# Patient Record
Sex: Male | Born: 1960
Health system: Southern US, Community
[De-identification: ages and names within clinical notes are randomized; demographics above are authoritative.]

## PROBLEM LIST (undated history)

## (undated) DIAGNOSIS — T79A21A Traumatic compartment syndrome of right lower extremity, initial encounter: Secondary | ICD-10-CM

## (undated) DIAGNOSIS — Z9289 Personal history of other medical treatment: Secondary | ICD-10-CM

## (undated) DIAGNOSIS — K5900 Constipation, unspecified: Secondary | ICD-10-CM

## (undated) DIAGNOSIS — S92501A Displaced unspecified fracture of right lesser toe(s), initial encounter for closed fracture: Secondary | ICD-10-CM

## (undated) DIAGNOSIS — E119 Type 2 diabetes mellitus without complications: Secondary | ICD-10-CM

## (undated) DIAGNOSIS — I10 Essential (primary) hypertension: Secondary | ICD-10-CM

---

## 2013-01-08 ENCOUNTER — Emergency Department (INDEPENDENT_AMBULATORY_CARE_PROVIDER_SITE_OTHER)
Admission: EM | Admit: 2013-01-08 | Discharge: 2013-01-08 | Disposition: A | Discharge status: Home / Self Care | Payer: Self-pay | Source: Home / Self Care

## 2013-01-08 ENCOUNTER — Encounter (HOSPITAL_COMMUNITY): Payer: Self-pay

## 2013-01-08 DIAGNOSIS — I10 Essential (primary) hypertension: Secondary | ICD-10-CM

## 2013-01-08 HISTORY — DX: Essential (primary) hypertension: I10

## 2013-01-08 LAB — HEMOGLOBIN A1C
Hgb A1c MFr Bld: 6.1 % — ABNORMAL HIGH (ref <5.7)
Mean Plasma Glucose: 128 mg/dL — ABNORMAL HIGH (ref <117)

## 2013-01-08 MED ORDER — LISINOPRIL-HYDROCHLOROTHIAZIDE 10-12.5 MG PO TABS: 1.0000 | ORAL_TABLET | Freq: Every day | ORAL | Status: DC

## 2013-01-08 NOTE — ED Notes (Signed)
Patient has a history of HTN needs medication refill 

## 2013-01-08 NOTE — ED Provider Notes (Signed)
History     CSN: [id number]  Arrival date & time 01/08/13  1041   First MD Initiated Contact with Patient 01/08/13 1210      Chief Complaint  Patient presents with  . Medication Refill    (Consider location/radiation/quality/duration/timing/severity/associated sxs/prior treatment) HPI 52 year old overweight male with history off hypertension for past few years comes to the clinic for medication refill and establishing care. He informs moving from [place] over a year back and was taking HCTZ and lisinopril combination (12.5-10 mg daily) and has run out of his prescription for past few months already. He denies any headache, blurry vision, dizziness, chest pain, palpitations, shortness of breath, muscle aches, joint pain, cough, sore throat, runny nose, abdominal pain, bowel or urinary symptoms. Denies polyuria, polydipsia, change in weight or appetite. Denies smoking and drinks alcohol occasionally. He denies participating in routine physical activity. Denies any history of heart disease or any surgical history.  Past Medical History  Diagnosis Date  . Hypertension     History reviewed. No pertinent past surgical history.  Family history Father had diabetes  History  Substance Use Topics  . Smoking status: Never Smoker   . Smokeless tobacco: Not on file  . Alcohol Use: Yes      Review of Systems  Allergies  Review of patient's allergies indicates no known allergies.  Home Medications   Current Outpatient Rx  Name  Route  Sig  Dispense  Refill  . lisinopril-hydrochlorothiazide (PRINZIDE,ZESTORETIC) 10-12.5 MG per tablet   Oral   Take 1 tablet by mouth daily.           BP 132/87  Pulse 95  Temp(Src) 98.5 F (36.9 C) (Oral)  SpO2 100%  Physical Exam \ Middle aged overweight male in no acute distress HEENT: No pallor, moist oral mucosa Chest: Clear to auscultation bilaterally no added sounds CVS: No murmurs rub or gallop Abdomen: Soft, nontender,  nondistended, bowel sounds present Extremities: Warm, no edema CNS: AAO x3 ED Course  Procedures (including critical care time)  Labs Reviewed - No data to display No results found.  Hypertension Patient's blood pressure appears to be stable at this time. He has not been on medications for few months already. I will prescribe him lisinopril-HCTZ (10-to 0.5 mg daily) Patient counseled on monitoring his diet and attempting to diet with low sodium, avoiding fatty foods, caffeine and sugary beverages. Avoiding preservative foods high in sodium contents as much possible. Also counseled on admitting to his medications and monitoring his blood pressure as outpatient at least once a week He should followup in the clinic in next 3 months.  Health maintenance I will check a lipid panel, BMET. and a hemoglobin A1c   MDM  Follow up in 3 months. Will call patient for any abnormal labs        Eddie North, MD 01/08/13 1225

## 2013-08-02 ENCOUNTER — Other Ambulatory Visit: Payer: Self-pay | Admitting: Internal Medicine

## 2013-08-24 ENCOUNTER — Observation Stay (HOSPITAL_COMMUNITY)
Admission: EM | Admit: 2013-08-24 | Discharge: 2013-08-25 | Disposition: A | Discharge status: Home / Self Care | Payer: 59 | Attending: Internal Medicine | Admitting: Internal Medicine

## 2013-08-24 ENCOUNTER — Encounter (HOSPITAL_COMMUNITY): Payer: Self-pay | Admitting: Emergency Medicine

## 2013-08-24 DIAGNOSIS — Z794 Long term (current) use of insulin: Secondary | ICD-10-CM | POA: Insufficient documentation

## 2013-08-24 DIAGNOSIS — R739 Hyperglycemia, unspecified: Secondary | ICD-10-CM

## 2013-08-24 DIAGNOSIS — Z79899 Other long term (current) drug therapy: Secondary | ICD-10-CM | POA: Insufficient documentation

## 2013-08-24 DIAGNOSIS — IMO0001 Reserved for inherently not codable concepts without codable children: Principal | ICD-10-CM | POA: Insufficient documentation

## 2013-08-24 DIAGNOSIS — IMO0002 Reserved for concepts with insufficient information to code with codable children: Secondary | ICD-10-CM

## 2013-08-24 DIAGNOSIS — I10 Essential (primary) hypertension: Secondary | ICD-10-CM | POA: Insufficient documentation

## 2013-08-24 DIAGNOSIS — E1165 Type 2 diabetes mellitus with hyperglycemia: Secondary | ICD-10-CM | POA: Diagnosis present

## 2013-08-24 DIAGNOSIS — R7309 Other abnormal glucose: Secondary | ICD-10-CM

## 2013-08-24 HISTORY — DX: Personal history of other medical treatment: Z92.89

## 2013-08-24 HISTORY — DX: Type 2 diabetes mellitus without complications: E11.9

## 2013-08-24 LAB — POCT I-STAT, CHEM 8
BUN: 30 mg/dL — ABNORMAL HIGH (ref 6–23)
Calcium, Ion: 1.09 mmol/L — ABNORMAL LOW (ref 1.12–1.23)
Chloride: 96 mEq/L (ref 96–112)
Creatinine, Ser: 1.6 mg/dL — ABNORMAL HIGH (ref 0.50–1.35)
Glucose, Bld: >700 mg/dL (ref 70–99)
HCT: 39 % (ref 39.0–52.0)
Hemoglobin: 13.3 g/dL (ref 13.0–17.0)
Potassium: 4.9 mEq/L (ref 3.5–5.1)
Sodium: 130 mEq/L — ABNORMAL LOW (ref 135–145)
TCO2: 21 mmol/L (ref 0–100)

## 2013-08-24 LAB — COMPREHENSIVE METABOLIC PANEL
ALT: 22 U/L (ref 0–53)
AST: 13 U/L (ref 0–37)
Albumin: 3.8 g/dL (ref 3.5–5.2)
Alkaline Phosphatase: 128 U/L — ABNORMAL HIGH (ref 39–117)
BUN: 32 mg/dL — ABNORMAL HIGH (ref 6–23)
CO2: 19 mEq/L (ref 19–32)
Calcium: 8.8 mg/dL (ref 8.4–10.5)
Chloride: 77 mEq/L — ABNORMAL LOW (ref 96–112)
Creatinine, Ser: 1.54 mg/dL — ABNORMAL HIGH (ref 0.50–1.35)
GFR calc Af Amer: 59 mL/min — ABNORMAL LOW (ref >90)
GFR calc non Af Amer: 51 mL/min — ABNORMAL LOW (ref >90)
Glucose, Bld: 1270 mg/dL (ref 70–99)
Potassium: 4.8 mEq/L (ref 3.5–5.1)
Sodium: 117 mEq/L — CL (ref 135–145)
Total Bilirubin: 1.1 mg/dL (ref 0.3–1.2)
Total Protein: 7.3 g/dL (ref 6.0–8.3)

## 2013-08-24 LAB — CBC
HCT: 42.8 % (ref 39.0–52.0)
Hemoglobin: 14.1 g/dL (ref 13.0–17.0)
MCH: 29.2 pg (ref 26.0–34.0)
MCHC: 32.9 g/dL (ref 30.0–36.0)
MCV: 88.6 fL (ref 78.0–100.0)
Platelets: 283 10*3/uL (ref 150–400)
RBC: 4.83 MIL/uL (ref 4.22–5.81)
RDW: 12.7 % (ref 11.5–15.5)
WBC: 8.6 10*3/uL (ref 4.0–10.5)

## 2013-08-24 LAB — GLUCOSE, CAPILLARY
Glucose-Capillary: 582 mg/dL (ref 70–99)
Glucose-Capillary: >600 mg/dL (ref 70–99)
Glucose-Capillary: >600 mg/dL (ref 70–99)
Glucose-Capillary: >600 mg/dL (ref 70–99)
Glucose-Capillary: >600 mg/dL (ref 70–99)
Glucose-Capillary: >600 mg/dL (ref 70–99)

## 2013-08-24 LAB — URINALYSIS, ROUTINE W REFLEX MICROSCOPIC
Bilirubin Urine: NEGATIVE
Glucose, UA: >1000 mg/dL — AB
Hgb urine dipstick: NEGATIVE
Ketones, ur: 15 mg/dL — AB
Leukocytes, UA: NEGATIVE
Nitrite: NEGATIVE
Protein, ur: NEGATIVE mg/dL
Specific Gravity, Urine: 1.037 — ABNORMAL HIGH (ref 1.005–1.030)
Urobilinogen, UA: 0.2 mg/dL (ref 0.0–1.0)
pH: 5 (ref 5.0–8.0)

## 2013-08-24 LAB — URINE MICROSCOPIC-ADD ON

## 2013-08-24 MED ORDER — INSULIN REGULAR BOLUS VIA INFUSION
0.0000 [IU] | Freq: Three times a day (TID) | INTRAVENOUS | Status: DC
  Filled 2013-08-24: qty 10

## 2013-08-24 MED ORDER — DOCUSATE SODIUM 100 MG PO CAPS
100.0000 mg | ORAL_CAPSULE | Freq: Two times a day (BID) | ORAL | Status: DC
  Filled 2013-08-24 (×3): qty 1

## 2013-08-24 MED ORDER — ENOXAPARIN SODIUM 60 MG/0.6ML ~~LOC~~ SOLN
60.0000 mg | SUBCUTANEOUS | Status: DC
  Filled 2013-08-24: qty 0.6

## 2013-08-24 MED ORDER — SODIUM CHLORIDE 0.9 % IV SOLN
INTRAVENOUS | Status: DC
  Administered 2013-08-24: 5.4 [IU]/h via INTRAVENOUS
  Filled 2013-08-24: qty 1

## 2013-08-24 MED ORDER — SODIUM CHLORIDE 0.9 % IV BOLUS (SEPSIS)
1000.0000 mL | Freq: Once | INTRAVENOUS | Status: AC
  Administered 2013-08-24: 1000 mL via INTRAVENOUS

## 2013-08-24 MED ORDER — HYDROCODONE-ACETAMINOPHEN 5-325 MG PO TABS: 1.0000 | ORAL_TABLET | ORAL | Status: DC | PRN

## 2013-08-24 MED ORDER — SODIUM CHLORIDE 0.9 % IJ SOLN
3.0000 mL | Freq: Two times a day (BID) | INTRAMUSCULAR | Status: DC
  Administered 2013-08-25: 3 mL via INTRAVENOUS

## 2013-08-24 MED ORDER — SODIUM CHLORIDE 0.9 % IV SOLN
INTRAVENOUS | Status: DC
  Administered 2013-08-24: 10.4 [IU]/h via INTRAVENOUS
  Administered 2013-08-25: 4 [IU]/h via INTRAVENOUS
  Administered 2013-08-25: 10.9 [IU]/h via INTRAVENOUS
  Filled 2013-08-24 (×2): qty 1

## 2013-08-24 MED ORDER — ACETAMINOPHEN 650 MG RE SUPP: 650.0000 mg | Freq: Four times a day (QID) | RECTAL | Status: DC | PRN

## 2013-08-24 MED ORDER — SODIUM CHLORIDE 0.9 % IV SOLN
INTRAVENOUS | Status: DC
  Administered 2013-08-24 – 2013-08-25 (×2): via INTRAVENOUS

## 2013-08-24 MED ORDER — DEXTROSE 50 % IV SOLN: 25.0000 mL | INTRAVENOUS | Status: DC | PRN

## 2013-08-24 MED ORDER — ACETAMINOPHEN 325 MG PO TABS: 650.0000 mg | ORAL_TABLET | Freq: Four times a day (QID) | ORAL | Status: DC | PRN

## 2013-08-24 NOTE — ED Notes (Signed)
Glucose reported to Dr.Pollina

## 2013-08-24 NOTE — H&P (Signed)
PCP: Thora Lance, MD    Chief Complaint:  "doctor send me over"  HPI: Marc Taylor is a 52 y.o. male   has a past medical history of Hypertension.   Presented with  2 week hx of Dry mouth, blurry vision, frequent urination, fatigue. He presented to his PCP thinking this was due to the flu Shot he took. His blood sugar was noted to be too high to read. He was sent to the ER. In ER he was found to have Blood glucose was 1270. PAtietn was started on [employer] and Hospitalist called for admission.  Denies any fever or chills, no chest pain.  Review of Systems:    Pertinent positives include: fatigue,  Constitutional:  No weight loss, night sweats, Fevers, chills,  weight loss  HEENT:  No headaches, Difficulty swallowing,Tooth/dental problems,Sore throat,  No sneezing, itching, ear ache, nasal congestion, post nasal drip,  Cardio-vascular:  No chest pain, Orthopnea, PND, anasarca, dizziness, palpitations.no Bilateral lower extremity swelling  GI:  No heartburn, indigestion, abdominal pain, nausea, vomiting, diarrhea, change in bowel habits, loss of appetite, melena, blood in stool, hematemesis Resp:  no shortness of breath at rest. No dyspnea on exertion, No excess mucus, no productive cough, No non-productive cough, No coughing up of blood.No change in color of mucus.No wheezing. Skin:  no rash or lesions. No jaundice GU:  no dysuria, change in color of urine, no urgency or frequency. No straining to urinate.  No flank pain.  Musculoskeletal:  No joint pain or no joint swelling. No decreased range of motion. No back pain.  Psych:  No change in mood or affect. No depression or anxiety. No memory loss.  Neuro: no localizing neurological complaints, no tingling, no weakness, no double vision, no gait abnormality, no slurred speech, no confusion  Otherwise ROS are negative except for above, 10 systems were reviewed  Past Medical History: Past Medical History   Diagnosis Date  . Hypertension    History reviewed. No pertinent past surgical history.   Medications: Prior to Admission medications   Medication Sig Start Date End Date Taking? Authorizing Provider  fish oil-omega-3 fatty acids 1000 MG capsule Take 1 g by mouth daily.   Yes Historical Provider, MD  lisinopril-hydrochlorothiazide (PRINZIDE,ZESTORETIC) 10-12.5 MG per tablet Take 1 tablet by mouth daily. 01/08/13  Yes Nishant Dhungel, MD    Allergies:  No Known Allergies  Social History:  Ambulatoryindependently  Lives at home   reports that he has never smoked. He does not have any smokeless tobacco history on file. He reports that he drinks alcohol. He reports that he does not use illicit drugs.   Family History: family history includes Cancer in his mother; Diabetes type II in his father and sister; Hypertension in his father and sister; Liver disease in his mother.    Physical Exam: Patient Vitals for the past 24 hrs:  BP Temp Temp src Pulse Resp SpO2  08/24/13 2215 125/55 mmHg - - 76 - 100 %  08/24/13 2200 137/82 mmHg - - 72 - 100 %  08/24/13 2145 117/64 mmHg - - 80 - 99 %  08/24/13 2130 121/63 mmHg - - 72 - 98 %  08/24/13 2115 127/62 mmHg - - 81 - 97 %  08/24/13 2045 86/46 mmHg - - 87 22 96 %  08/24/13 2030 94/46 mmHg - - 84 24 96 %  08/24/13 2015 102/47 mmHg - - 80 29 97 %  08/24/13 2000 110/55 mmHg - - 77 23 98 %  08/24/13 1945 99/54 mmHg - - 77 16 94 %  08/24/13 1719 116/69 mmHg 98 F (36.7 C) Oral 97 16 98 %    1. General:  in No Acute distress 2. Psychological: Alert and Oriented 3. Head/ENT:   Dry Mucous Membranes                          Head Non traumatic, neck supple                          Normal  Dentition 4. SKIN:   decreased Skin turgor,  Skin clean Dry and intact no rash 5. Heart: Regular rate and rhythm no Murmur, Rub or gallop 6. Lungs: Clear to auscultation bilaterally, no wheezes or crackles   7. Abdomen: Soft, non-tender, Non distended,  obese 8. Lower extremities: no clubbing, cyanosis, or edema 9. Neurologically Grossly intact, moving all 4 extremities equally 10. MSK: Normal range of motion  body mass index is Marc because there is no height or weight on file.   Labs on Admission:   Recent Labs  08/24/13 1721 08/24/13 2158  NA 117* 130*  K 4.8 4.9  CL 77* 96  CO2 19  --   GLUCOSE 1270* >700*  BUN 32* 30*  CREATININE 1.54* 1.60*  CALCIUM 8.8  --     Recent Labs  08/24/13 1721  AST 13  ALT 22  ALKPHOS 128*  BILITOT 1.1  PROT 7.3  ALBUMIN 3.8   No results found for this basename: LIPASE, AMYLASE,  in the last 72 hours  Recent Labs  08/24/13 1721 08/24/13 2158  WBC 8.6  --   HGB 14.1 13.3  HCT 42.8 39.0  MCV 88.6  --   PLT 283  --    No results found for this basename: CKTOTAL, CKMB, CKMBINDEX, TROPONINI,  in the last 72 hours No results found for this basename: TSH, T4TOTAL, FREET3, T3FREE, THYROIDAB,  in the last 72 hours No results found for this basename: VITAMINB12, FOLATE, FERRITIN, TIBC, IRON, RETICCTPCT,  in the last 72 hours Lab Results  Component Value Date   HGBA1C 6.1* 01/08/2013    CrCl is Marc because there is no height on file for the current visit. ABG    Component Value Date/Time   TCO2 21 08/24/2013 2158     No results found for this basename: DDIMER      UA hyperglycemia, Ketones   Cultures: No results found for this basename: sdes, specrequest, cult, reptstatus       Radiological Exams on Admission: No results found.  Chart has been reviewed  Assessment/Plan  52 yo W hx of HTN here with hyperglycemia and new diagnosis of diabetes.  Present on Admission:  . Hyperglycemia - in the setting of new diagnosis of DM2, will continue insulin drip, IVF, diabetes education . Diabetes type 2, uncontrolled - Will likely need to be on long lasting insulin at the time of discharge given severity of hyperglycemia . HTN (hypertension) - hold lisinopril due to  low BP in ER SBP down to 86/46 initially now resolved.     Prophylaxis:   Lovenox, Protonix  CODE STATUS: FULL CODE  Other plan as per orders.  I have spent a total of 55 min on this admission  Marc Taylor 08/24/2013, 10:35 PM

## 2013-08-24 NOTE — ED Notes (Signed)
Went to [facility] today for fatigue. Reports he has felt tired since taking the flu and pneumonia shot 2 weeks ago. States he has also been very thirsty with a dry mouth. ucc checked and CBG was 500.

## 2013-08-24 NOTE — ED Provider Notes (Signed)
CSN: [id number]     Arrival date & time 08/24/13  1711 History   First MD Initiated Contact with Patient 08/24/13 1853     Chief Complaint  Patient presents with  . Hyperglycemia   (Consider location/radiation/quality/duration/timing/severity/associated sxs/prior Treatment) HPI Comments: 52 year old male with a past medical history of hypertension presents to the emergency department with his friend from his primary care's office with concerns of hyperglycemia. Patient states 2-3 weeks ago he had both the flu and pneumonia shots, since then he has been very fatigued. He has also been experiencing increased urination, increased thirst and just feels worn out. His vision has been blurry on and off and has had a dry mouth. Denies abdominal pain, nausea, vomiting, fever, chills, chest pain or shortness of breath. At the primary care office today his glucose was 500, no prior history of diabetes. No confusion.  Patient is a 52 y.o. male presenting with hyperglycemia. The history is provided by the patient and a friend.  Hyperglycemia Associated symptoms: fatigue, increased thirst and polyuria   Associated symptoms: no abdominal pain, no fever, no nausea and no vomiting     Past Medical History  Diagnosis Date  . Hypertension    History reviewed. No pertinent past surgical history. History reviewed. No pertinent family history. History  Substance Use Topics  . Smoking status: Never Smoker   . Smokeless tobacco: Not on file  . Alcohol Use: Yes    Review of Systems  Constitutional: Positive for fatigue. Negative for fever and chills.  HENT:       Positive for dry mouth.  Eyes: Positive for visual disturbance.  Gastrointestinal: Negative for nausea, vomiting and abdominal pain.  Endocrine: Positive for polydipsia and polyuria.  Neurological: Positive for weakness.  All other systems reviewed and are negative.    Allergies  Review of patient's allergies indicates no known  allergies.  Home Medications   Current Outpatient Rx  Name  Route  Sig  Dispense  Refill  . lisinopril-hydrochlorothiazide (PRINZIDE,ZESTORETIC) 10-12.5 MG per tablet   Oral   Take 1 tablet by mouth daily.   30 tablet   3    BP 116/69  Pulse 97  Temp(Src) 98 F (36.7 C) (Oral)  Resp 16  SpO2 98% Physical Exam  Nursing note and vitals reviewed. Constitutional: He is oriented to person, place, and time. He appears well-developed and well-nourished. No distress.  Obese  HENT:  Head: Normocephalic and atraumatic.  Mouth/Throat: Uvula is midline and oropharynx is clear and moist. Mucous membranes are dry.  Eyes: Conjunctivae and EOM are normal. Pupils are equal, round, and reactive to light. No scleral icterus.  Neck: Normal range of motion. Neck supple.  Cardiovascular: Normal rate, regular rhythm and normal heart sounds.   Pulmonary/Chest: Effort normal and breath sounds normal.  Abdominal: Soft. Bowel sounds are normal. There is no tenderness.  Musculoskeletal: Normal range of motion. He exhibits no edema.  Neurological: He is alert and oriented to person, place, and time. He has normal strength. No cranial nerve deficit or sensory deficit. GCS eye subscore is 4. GCS verbal subscore is 5. GCS motor subscore is 6.  Skin: Skin is warm and dry. He is not diaphoretic.  Psychiatric: He has a normal mood and affect. His speech is normal and behavior is normal.    ED Course  Procedures (including critical care time) Labs Review Labs Reviewed  COMPREHENSIVE METABOLIC PANEL - Abnormal; Notable for the following:    Sodium 117 (*)  Chloride 77 (*)    Glucose, Bld 1270 (*)    BUN 32 (*)    Creatinine, Ser 1.54 (*)    Alkaline Phosphatase 128 (*)    GFR calc non Af Amer 51 (*)    GFR calc Af Amer 59 (*)    All other components within normal limits  CBC   Imaging Review No results found.  EKG Interpretation   None       MDM   1. Hyperglycemia     Patient with  hyperosmolar hyperglycemic nonketotic syndrome. He is alert and oriented x3, normal vital signs. Glucose on CMP 1270, sodium 117. Plan to give IV fluids until sugar is decreased, will check a CBG every half hour. Plan to admit patient. Case discussed with my attending Dr. Blinda Leatherwood who agrees with plan of care. 9:59 PM Patient is still alert and well appearing with normal vital signs. He has received 2-1/2 L of fluid. Glucose still Greater than 600. Admission accepted by Dr. Adela Glimpse, [facility]. Insulin drip started.  Trevor Mace, PA-C 08/24/13 2200

## 2013-08-24 NOTE — ED Provider Notes (Signed)
Medical screening examination/treatment/procedure(s) were conducted as a shared visit with non-physician practitioner(s) and myself.  I personally evaluated the patient during the encounter.  Presents for increased thirst and generalized weakness. Glucose was elevated her primary care doctor's office, was sent to the ER for further evaluation. Glucose is greater than 1200. No signs of ketoacidosis, however. Presentation consistent with hyperosmolar nonketotic state. Patient aggressively hydrated he will be admitted.    Gilda Crease, MD 08/24/13 [phone]

## 2013-08-25 LAB — GLUCOSE, CAPILLARY
Glucose-Capillary: 140 mg/dL — ABNORMAL HIGH (ref 70–99)
Glucose-Capillary: 148 mg/dL — ABNORMAL HIGH (ref 70–99)
Glucose-Capillary: 164 mg/dL — ABNORMAL HIGH (ref 70–99)
Glucose-Capillary: 166 mg/dL — ABNORMAL HIGH (ref 70–99)
Glucose-Capillary: 166 mg/dL — ABNORMAL HIGH (ref 70–99)
Glucose-Capillary: 175 mg/dL — ABNORMAL HIGH (ref 70–99)
Glucose-Capillary: 182 mg/dL — ABNORMAL HIGH (ref 70–99)
Glucose-Capillary: 210 mg/dL — ABNORMAL HIGH (ref 70–99)
Glucose-Capillary: 274 mg/dL — ABNORMAL HIGH (ref 70–99)
Glucose-Capillary: 285 mg/dL — ABNORMAL HIGH (ref 70–99)
Glucose-Capillary: 332 mg/dL — ABNORMAL HIGH (ref 70–99)
Glucose-Capillary: 423 mg/dL — ABNORMAL HIGH (ref 70–99)
Glucose-Capillary: 479 mg/dL — ABNORMAL HIGH (ref 70–99)

## 2013-08-25 LAB — COMPREHENSIVE METABOLIC PANEL
ALT: 19 U/L (ref 0–53)
AST: 14 U/L (ref 0–37)
Albumin: 3.3 g/dL — ABNORMAL LOW (ref 3.5–5.2)
Alkaline Phosphatase: 90 U/L (ref 39–117)
BUN: 19 mg/dL (ref 6–23)
CO2: 27 mEq/L (ref 19–32)
Calcium: 8.5 mg/dL (ref 8.4–10.5)
Chloride: 103 mEq/L (ref 96–112)
Creatinine, Ser: 0.86 mg/dL (ref 0.50–1.35)
GFR calc Af Amer: >90 mL/min (ref >90)
GFR calc non Af Amer: >90 mL/min (ref >90)
Glucose, Bld: 200 mg/dL — ABNORMAL HIGH (ref 70–99)
Potassium: 3.4 mEq/L — ABNORMAL LOW (ref 3.5–5.1)
Sodium: 140 mEq/L (ref 135–145)
Total Bilirubin: 0.4 mg/dL (ref 0.3–1.2)
Total Protein: 6.5 g/dL (ref 6.0–8.3)

## 2013-08-25 LAB — CBC
HCT: 35.6 % — ABNORMAL LOW (ref 39.0–52.0)
Hemoglobin: 13.2 g/dL (ref 13.0–17.0)
MCH: 29.8 pg (ref 26.0–34.0)
MCHC: 36.2 g/dL — ABNORMAL HIGH (ref 30.0–36.0)
MCV: 80.5 fL (ref 78.0–100.0)
Platelets: 255 10*3/uL (ref 150–400)
RBC: 4.56 MIL/uL (ref 4.22–5.81)
RDW: 12.7 % (ref 11.5–15.5)
WBC: 7.7 10*3/uL (ref 4.0–10.5)

## 2013-08-25 LAB — HEMOGLOBIN A1C
Hgb A1c MFr Bld: 14 % — ABNORMAL HIGH (ref <5.7)
Hgb A1c MFr Bld: 13.6 % — ABNORMAL HIGH (ref <5.7)
Mean Plasma Glucose: 355 mg/dL — ABNORMAL HIGH (ref <117)
Mean Plasma Glucose: 344 mg/dL — ABNORMAL HIGH (ref <117)

## 2013-08-25 LAB — TSH: TSH: 0.673 u[IU]/mL (ref 0.350–4.500)

## 2013-08-25 LAB — MAGNESIUM: Magnesium: 2.4 mg/dL (ref 1.5–2.5)

## 2013-08-25 LAB — PHOSPHORUS: Phosphorus: 2.5 mg/dL (ref 2.3–4.6)

## 2013-08-25 MED ORDER — INSULIN REGULAR BOLUS VIA INFUSION
0.0000 [IU] | Freq: Three times a day (TID) | INTRAVENOUS | Status: AC
  Filled 2013-08-25: qty 10

## 2013-08-25 MED ORDER — INSULIN ASPART 100 UNIT/ML ~~LOC~~ SOLN: 0.0000 [IU] | Freq: Every day | SUBCUTANEOUS | Status: DC

## 2013-08-25 MED ORDER — INSULIN GLARGINE 100 UNIT/ML ~~LOC~~ SOLN
15.0000 [IU] | Freq: Every day | SUBCUTANEOUS | Status: DC
  Filled 2013-08-25: qty 0.15

## 2013-08-25 MED ORDER — METFORMIN HCL ER 750 MG PO TB24
750.0000 mg | ORAL_TABLET | Freq: Every day | ORAL | Status: DC
  Administered 2013-08-25: 750 mg via ORAL
  Filled 2013-08-25 (×2): qty 1

## 2013-08-25 MED ORDER — DEXTROSE-NACL 5-0.45 % IV SOLN
INTRAVENOUS | Status: DC
  Administered 2013-08-25: 07:00:00 via INTRAVENOUS

## 2013-08-25 MED ORDER — LIVING WELL WITH DIABETES BOOK
Freq: Once | Status: AC
  Administered 2013-08-25: 13:00:00
  Filled 2013-08-25: qty 1

## 2013-08-25 MED ORDER — DEXTROSE-NACL 5-0.45 % IV SOLN
INTRAVENOUS | Status: AC
  Administered 2013-08-25: 10:00:00 via INTRAVENOUS

## 2013-08-25 MED ORDER — INSULIN GLARGINE 100 UNIT/ML ~~LOC~~ SOLN
20.0000 [IU] | Freq: Every day | SUBCUTANEOUS | Status: DC
  Administered 2013-08-25: 20 [IU] via SUBCUTANEOUS
  Filled 2013-08-25: qty 0.2

## 2013-08-25 MED ORDER — FREESTYLE SYSTEM KIT: 1.0000 | PACK | Freq: Three times a day (TID) | Status: DC

## 2013-08-25 MED ORDER — POTASSIUM CHLORIDE CRYS ER 20 MEQ PO TBCR
40.0000 meq | EXTENDED_RELEASE_TABLET | Freq: Once | ORAL | Status: AC
  Administered 2013-08-25: 40 meq via ORAL
  Filled 2013-08-25: qty 2

## 2013-08-25 MED ORDER — INSULIN ASPART 100 UNIT/ML ~~LOC~~ SOLN: SUBCUTANEOUS | Status: DC

## 2013-08-25 MED ORDER — INSULIN GLARGINE 100 UNIT/ML ~~LOC~~ SOLN: 20.0000 [IU] | Freq: Every day | SUBCUTANEOUS | Status: DC

## 2013-08-25 MED ORDER — INSULIN ASPART 100 UNIT/ML ~~LOC~~ SOLN
0.0000 [IU] | Freq: Three times a day (TID) | SUBCUTANEOUS | Status: DC
  Administered 2013-08-25: 2 [IU] via SUBCUTANEOUS
  Administered 2013-08-25: 5 [IU] via SUBCUTANEOUS

## 2013-08-25 MED ORDER — INSULIN PEN STARTER KIT
1.0000 | Freq: Once | Status: AC
  Administered 2013-08-25: 1
  Filled 2013-08-25: qty 1

## 2013-08-25 MED ORDER — INSULIN REGULAR HUMAN 100 UNIT/ML IJ SOLN
INTRAMUSCULAR | Status: DC
  Administered 2013-08-25: 11:00:00 via INTRAVENOUS
  Filled 2013-08-25: qty 1

## 2013-08-25 MED ORDER — METFORMIN HCL ER 750 MG PO TB24: 750.0000 mg | ORAL_TABLET | Freq: Every day | ORAL | Status: DC

## 2013-08-25 NOTE — Discharge Summary (Signed)
[facility]                                                                                   Marc Taylor, is a 52 y.o. male  DOB Jan 28, 1961  MRN [id number].  Admission date:  08/24/2013  Admitting Physician  Therisa Doyne, MD  Discharge Date:  08/25/2013   Primary MD  Thora Lance, MD  Admission Diagnosis  Hyperglycemia [790.29]  Discharge Diagnosis     Active Problems:   Diabetes type 2, uncontrolled   HTN (hypertension)   Hyperglycemia    Past Medical History  Diagnosis Date  . Hypertension   . Type II diabetes mellitus     "just dx'd" (08/24/2013)  . History of blood transfusion 1963    "one; I had yellow jaundice" (08/24/2013)    Past Surgical History  Procedure Laterality Date  . No past surgeries       Recommendations for primary care physician for things to follow:   Follow A1c and CBGs closely adjust insulin dose and diabetic medications as needed.   Discharge Diagnoses:   Active Problems:   Diabetes type 2, uncontrolled   HTN (hypertension)   Hyperglycemia    Discharge Condition: Stable   Follow-up Information   Follow up with Thora Lance, MD. Schedule an appointment as soon as possible for a visit in 3 days.   Specialty:  Family Medicine   Contact information:   [address], [address] [place] [address] [phone]         Consults obtained - diabetic educator   Discharge Medications      Medication List         fish oil-omega-3 fatty acids 1000 MG capsule  Take 1 g by mouth daily.     insulin aspart 100 UNIT/ML injection  Commonly known as:  novoLOG  - Before each meal 3 times a day, 140-199 - 2 units, 200-250 - 6 units, 251-299 - 10 units,  300-349 - 12 units,  350 or above 12 units.  - Insulin PEN if approved, provide syringes and needles if needed.     insulin glargine 100 UNIT/ML injection  Commonly known as:  LANTUS  Inject 0.2 mLs (20 Units total) into the skin daily.     lisinopril-hydrochlorothiazide 10-12.5 MG per tablet  Commonly known as:  PRINZIDE,ZESTORETIC  Take 1 tablet by mouth daily.     metFORMIN 750 MG 24 hr tablet  Commonly known as:  GLUCOPHAGE-XR  Take 1 tablet (750 mg total) by mouth daily with breakfast.         Diet and Activity recommendation: See Discharge Instructions below   Discharge Instructions     Follow with Primary MD Thora Lance, MD in 3 days   Get CBC, CMP, A1c checked 3 days by Primary MD and again as instructed by your Primary MD.   Accuchecks 4 times/day, Once in AM empty stomach and then before each meal. Log in all results and show them to your Prim.MD in 3 days. If any glucose reading is under 80 or above 300 call your Prim MD immidiately. Follow Low glucose instructions for glucose under 80 as instructed.  Get Medicines reviewed and adjusted.  Please request your Prim.MD to go over all Hospital Tests and Procedure/Radiological results at the follow up, please get all Hospital records sent to your Prim MD by signing hospital release before you go home.  Activity: As tolerated with Full fall precautions use walker/cane & assistance as needed   Diet:  Heart healthy low carbohydrate  For Heart failure patients - Check your Weight same time everyday, if you gain over 2 pounds, or you develop in leg swelling, experience more shortness of breath or chest pain, call your Primary MD immediately. Follow Cardiac Low Salt Diet and 1.8 lit/day fluid restriction.  Disposition Home   If you experience worsening of your admission symptoms, develop shortness of breath, life threatening emergency, suicidal or homicidal thoughts you must seek medical attention immediately by calling 911 or calling your MD immediately  if symptoms less severe.  You Must read complete instructions/literature along with all the possible adverse reactions/side effects for all the Medicines you take and that have been prescribed to you.  Take any new Medicines after you have completely understood and accpet all the possible adverse reactions/side effects.   Do not drive and provide baby sitting services if your were admitted for syncope or siezures until you have seen by Primary MD or a Neurologist and advised to do so again.  Do not drive when taking Pain medications.    Do not take more than prescribed Pain, Sleep and Anxiety Medications  Special Instructions: If you have smoked or chewed Tobacco  in the last 2 yrs please stop smoking, stop any regular Alcohol  and or any Recreational drug use.  Wear Seat belts while driving.   Please note  You were cared for by a hospitalist during your hospital stay. If you have any questions about your discharge medications or the care you received while you were in the hospital after you are discharged, you can call the unit and asked to speak with the hospitalist on call if the hospitalist that took care of you is not available. Once you are discharged, your primary care physician will handle any further medical issues. Please note that NO REFILLS for any discharge medications will be authorized once you are discharged, as it is imperative that you return to your primary care physician (or establish a relationship with a primary care physician if you do not have one) for your aftercare needs so that they can reassess your need for medications and monitor your lab values.   Major procedures and Radiology Reports - PLEASE review detailed and final reports for all details, in brief -       No results found.  Micro Results      No results found for this or any previous visit (from the past 240 hour(s)).   History of present illness and  Hospital Course:     Kindly see H&P for history of present illness and admission details, please review complete Labs, Consult reports and Test reports for all details in brief Marc Taylor, is a 52 y.o. male, patient with history of   hypertension, obesity, who started feeling weak all over with polydipsia polyphagia and polyuria ongoing for the last few weeks, presented to the PCP office where he was found to have blood sugars of over 1000, was sent to the ER for uncontrolled newly diagnosed type 2 diabetes mellitus with possible nonketotic hyperosmolar state.   He was admitted to the hospital with IV fluids for hydration along  with glucose stabilizer drip, within 10 hours his sugars have come down to acceptable limits in low 150s, he will now be transitioned to Lantus, Glucophage along with sliding scale insulin. He will receive diabetic and insulin education along with education on how to check his blood sugars. He is completely symptom free and feeling much better. He will be placed on Lantus, sliding scale insulin with meals along with Glucophage XR. He will be provided with glucose testing supplies with written instructions to check his glucose 4 times a day, to maintain a logbook and present the log book to his PCP in 3 days. He might need continued glucose monitoring with adjustment of his insulin and diabetic medications by his PCP. One time outpatient endocrine consult, ophthalmology consult is recommended. His A1c checked here is still pending .   Low potassium will be replaced prior to discharge.   Today   Subjective:   Marc Taylor today has no headache,no chest abdominal pain,no new weakness tingling or numbness, feels much better wants to go home today.    Objective:   Blood pressure 107/62, pulse 76, temperature 97.6 F (36.4 C), temperature source Oral, resp. rate 18, height 5\' 9"  (1.753 m), weight 117.6 kg (259 lb 4.2 oz), SpO2 97.00%.   Intake/Output Summary (Last 24 hours) at 08/25/13 0956 Last data filed at 08/25/13 0547  Gross per 24 hour  Intake   1405 ml  Output   2100 ml  Net   -695 ml    Exam Awake Alert, Oriented *3, No new F.N deficits, Normal affect [place].AT,PERRAL Supple Neck,No JVD, No  cervical lymphadenopathy appriciated.  Symmetrical Chest wall movement, Good air movement bilaterally, CTAB RRR,No Gallops,Rubs or new Murmurs, No Parasternal Heave +ve B.Sounds, Abd Soft, Non tender, No organomegaly appriciated, No rebound -guarding or rigidity. No Cyanosis, Clubbing or edema, No new Rash or bruise  Data Review   Lab Results  Component Value Date   HGBA1C 6.1* 01/08/2013    CBG (last 3)   Recent Labs  08/25/13 0705 08/25/13 0814 08/25/13 0925  GLUCAP 148* 140* 175*      CBC w Diff: Lab Results  Component Value Date   WBC 7.7 08/25/2013   HGB 13.2 08/25/2013   HCT 35.6* 08/25/2013   PLT 255 08/25/2013    CMP: Lab Results  Component Value Date   NA 140 08/25/2013   K 3.4* 08/25/2013   CL 103 08/25/2013   CO2 27 08/25/2013   BUN 19 08/25/2013   CREATININE 0.86 08/25/2013   PROT 6.5 08/25/2013   ALBUMIN 3.3* 08/25/2013   BILITOT 0.4 08/25/2013   ALKPHOS 90 08/25/2013   AST 14 08/25/2013   ALT 19 08/25/2013  .   Total Time in preparing paper work, data evaluation and todays exam - 35 minutes  Leroy Sea M.D on 08/25/2013 at 9:56 AM  [facility] Office  [phone]

## 2013-08-25 NOTE — Progress Notes (Addendum)
Patient discharged.  Educated on Type II Diabetes using the Living Well with Diabetes handbook and Insulin start-up kit. Patient viewed diabetes education videos [phone]) on room television.  Chart of insulin usage with patient sliding scale given to patient.  Patient correctly demonstrated use of glucometer and insulin needles.  Correctly identified insulin needs for blood glucose ranges.  Insulin and Metformin prescription given to patient.  Glucom[facility] prescription [facility] sent to [facility].  Patient and family member educated on discharge instructions.  IV removed.  Telemetry discontinued.  CMT notified.  Telemetry box returned and signed back into telemetry log.  Patient belongings gathered.  Instructions for follow-up appointment given to patient.  Escorted out with NT

## 2014-09-03 ENCOUNTER — Emergency Department (HOSPITAL_COMMUNITY): Payer: Worker's Compensation

## 2014-09-03 ENCOUNTER — Encounter (HOSPITAL_COMMUNITY): Payer: Self-pay | Admitting: Emergency Medicine

## 2014-09-03 ENCOUNTER — Emergency Department (HOSPITAL_COMMUNITY)
Admission: EM | Admit: 2014-09-03 | Discharge: 2014-09-03 | Disposition: A | Discharge status: Home / Self Care | Payer: Worker's Compensation | Attending: Emergency Medicine | Admitting: Emergency Medicine

## 2014-09-03 DIAGNOSIS — S92301A Fracture of unspecified metatarsal bone(s), right foot, initial encounter for closed fracture: Secondary | ICD-10-CM

## 2014-09-03 DIAGNOSIS — Z79899 Other long term (current) drug therapy: Secondary | ICD-10-CM | POA: Diagnosis not present

## 2014-09-03 DIAGNOSIS — S92501A Displaced unspecified fracture of right lesser toe(s), initial encounter for closed fracture: Secondary | ICD-10-CM | POA: Insufficient documentation

## 2014-09-03 DIAGNOSIS — IMO0002 Reserved for concepts with insufficient information to code with codable children: Secondary | ICD-10-CM

## 2014-09-03 DIAGNOSIS — S81811A Laceration without foreign body, right lower leg, initial encounter: Secondary | ICD-10-CM | POA: Diagnosis not present

## 2014-09-03 DIAGNOSIS — E119 Type 2 diabetes mellitus without complications: Secondary | ICD-10-CM | POA: Diagnosis not present

## 2014-09-03 DIAGNOSIS — Y9339 Activity, other involving climbing, rappelling and jumping off: Secondary | ICD-10-CM | POA: Diagnosis not present

## 2014-09-03 DIAGNOSIS — S92334A Nondisplaced fracture of third metatarsal bone, right foot, initial encounter for closed fracture: Secondary | ICD-10-CM | POA: Insufficient documentation

## 2014-09-03 DIAGNOSIS — I10 Essential (primary) hypertension: Secondary | ICD-10-CM | POA: Insufficient documentation

## 2014-09-03 DIAGNOSIS — S99921A Unspecified injury of right foot, initial encounter: Secondary | ICD-10-CM | POA: Diagnosis present

## 2014-09-03 DIAGNOSIS — S92911A Unspecified fracture of right toe(s), initial encounter for closed fracture: Secondary | ICD-10-CM

## 2014-09-03 DIAGNOSIS — Y9389 Activity, other specified: Secondary | ICD-10-CM | POA: Diagnosis not present

## 2014-09-03 HISTORY — DX: Type 2 diabetes mellitus without complications: E11.9

## 2014-09-03 MED ORDER — MORPHINE SULFATE 4 MG/ML IJ SOLN
4.0000 mg | Freq: Once | INTRAMUSCULAR | Status: AC
  Administered 2014-09-03: 4 mg via INTRAVENOUS
  Filled 2014-09-03: qty 1

## 2014-09-03 MED ORDER — OXYCODONE-ACETAMINOPHEN 5-325 MG PO TABS
1.0000 | ORAL_TABLET | Freq: Four times a day (QID) | ORAL | Status: AC | PRN
Start: 2014-09-03 — End: ?

## 2014-09-03 MED ORDER — LIDOCAINE HCL 2 % IJ SOLN
20.0000 mL | Freq: Once | INTRAMUSCULAR | Status: AC
  Administered 2014-09-03: 400 mg
  Filled 2014-09-03: qty 20

## 2014-09-03 MED ORDER — ONDANSETRON HCL 4 MG/2ML IJ SOLN
4.0000 mg | Freq: Once | INTRAMUSCULAR | Status: AC
  Administered 2014-09-03: 4 mg via INTRAVENOUS
  Filled 2014-09-03: qty 2

## 2014-09-03 MED ORDER — HYDROMORPHONE HCL 1 MG/ML IJ SOLN
1.0000 mg | Freq: Once | INTRAMUSCULAR | Status: AC
  Administered 2014-09-03: 1 mg via INTRAVENOUS
  Filled 2014-09-03: qty 1

## 2014-09-03 MED ORDER — OXYCODONE-ACETAMINOPHEN 5-325 MG PO TABS
2.0000 | ORAL_TABLET | Freq: Once | ORAL | Status: AC
  Administered 2014-09-03: 2 via ORAL
  Filled 2014-09-03: qty 2

## 2014-09-03 NOTE — Progress Notes (Addendum)
[facility] Stacy, ° °Provided pt with a list of primary care resources to help patient establish a pcp.  °

## 2014-09-03 NOTE — ED Notes (Signed)
Patient transported to X-ray 
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
## 2014-09-03 NOTE — ED Notes (Signed)
Ortho tech in room 
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
## 2014-09-04 ENCOUNTER — Inpatient Hospital Stay (HOSPITAL_COMMUNITY): Payer: Worker's Compensation | Admitting: Certified Registered Nurse Anesthetist

## 2014-09-04 ENCOUNTER — Encounter (HOSPITAL_COMMUNITY): Payer: Worker's Compensation | Admitting: Certified Registered Nurse Anesthetist

## 2014-09-04 ENCOUNTER — Inpatient Hospital Stay (HOSPITAL_COMMUNITY)
Admission: RE | Admit: 2014-09-04 | Discharge: 2014-09-09 | DRG: 904 | Disposition: A | Discharge status: Home Health | Payer: Worker's Compensation | Source: Ambulatory Visit | Attending: Orthopedic Surgery | Admitting: Orthopedic Surgery

## 2014-09-04 ENCOUNTER — Encounter (HOSPITAL_COMMUNITY): Payer: Self-pay | Admitting: Certified Registered Nurse Anesthetist

## 2014-09-04 ENCOUNTER — Encounter (HOSPITAL_COMMUNITY)
Admission: RE | Disposition: A | Discharge status: Home Health | Payer: Self-pay | Source: Ambulatory Visit | Attending: Orthopedic Surgery

## 2014-09-04 ENCOUNTER — Inpatient Hospital Stay (HOSPITAL_COMMUNITY): Payer: Worker's Compensation

## 2014-09-04 DIAGNOSIS — T79A21A Traumatic compartment syndrome of right lower extremity, initial encounter: Secondary | ICD-10-CM | POA: Diagnosis present

## 2014-09-04 DIAGNOSIS — Z794 Long term (current) use of insulin: Secondary | ICD-10-CM

## 2014-09-04 DIAGNOSIS — I10 Essential (primary) hypertension: Secondary | ICD-10-CM | POA: Diagnosis present

## 2014-09-04 DIAGNOSIS — Z79899 Other long term (current) drug therapy: Secondary | ICD-10-CM

## 2014-09-04 DIAGNOSIS — S92501A Displaced unspecified fracture of right lesser toe(s), initial encounter for closed fracture: Secondary | ICD-10-CM

## 2014-09-04 DIAGNOSIS — S92511A Displaced fracture of proximal phalanx of right lesser toe(s), initial encounter for closed fracture: Secondary | ICD-10-CM | POA: Diagnosis present

## 2014-09-04 DIAGNOSIS — T79A29A Traumatic compartment syndrome of unspecified lower extremity, initial encounter: Secondary | ICD-10-CM | POA: Diagnosis present

## 2014-09-04 DIAGNOSIS — S97121A Crushing injury of right lesser toe(s), initial encounter: Secondary | ICD-10-CM | POA: Diagnosis present

## 2014-09-04 DIAGNOSIS — E119 Type 2 diabetes mellitus without complications: Secondary | ICD-10-CM | POA: Diagnosis present

## 2014-09-04 DIAGNOSIS — Z6841 Body Mass Index (BMI) 40.0 and over, adult: Secondary | ICD-10-CM

## 2014-09-04 DIAGNOSIS — S91031A Puncture wound without foreign body, right ankle, initial encounter: Secondary | ICD-10-CM | POA: Diagnosis present

## 2014-09-04 DIAGNOSIS — S92901A Unspecified fracture of right foot, initial encounter for closed fracture: Secondary | ICD-10-CM | POA: Diagnosis present

## 2014-09-04 HISTORY — PX: OPEN REDUCTION INTERNAL FIXATION (ORIF) FOOT LISFRANC FRACTURE: SHX5990

## 2014-09-04 HISTORY — DX: Displaced unspecified fracture of right lesser toe(s), initial encounter for closed fracture: S92.501A

## 2014-09-04 HISTORY — PX: FASCIOTOMY: SHX132

## 2014-09-04 HISTORY — DX: Traumatic compartment syndrome of right lower extremity, initial encounter: T79.A21A

## 2014-09-04 LAB — CBC
HCT: 41 % (ref 39.0–52.0)
Hemoglobin: 14.7 g/dL (ref 13.0–17.0)
MCH: 29.9 pg (ref 26.0–34.0)
MCHC: 35.9 g/dL (ref 30.0–36.0)
MCV: 83.5 fL (ref 78.0–100.0)
Platelets: 251 10*3/uL (ref 150–400)
RBC: 4.91 MIL/uL (ref 4.22–5.81)
RDW: 13.7 % (ref 11.5–15.5)
WBC: 10.7 10*3/uL — ABNORMAL HIGH (ref 4.0–10.5)

## 2014-09-04 LAB — BASIC METABOLIC PANEL WITH GFR
Anion gap: 17 — ABNORMAL HIGH (ref 5–15)
BUN: 18 mg/dL (ref 6–23)
CO2: 21 meq/L (ref 19–32)
Calcium: 8.9 mg/dL (ref 8.4–10.5)
Chloride: 98 meq/L (ref 96–112)
Creatinine, Ser: 0.88 mg/dL (ref 0.50–1.35)
GFR calc Af Amer: >90 mL/min
GFR calc non Af Amer: >90 mL/min
Glucose, Bld: 83 mg/dL (ref 70–99)
Potassium: 4.8 meq/L (ref 3.7–5.3)
Sodium: 136 meq/L — ABNORMAL LOW (ref 137–147)

## 2014-09-04 LAB — GLUCOSE, CAPILLARY
Glucose-Capillary: 87 mg/dL (ref 70–99)
Glucose-Capillary: 93 mg/dL (ref 70–99)

## 2014-09-04 SURGERY — FASCIOTOMY, UPPER EXTREMITY
Anesthesia: General | Laterality: Right

## 2014-09-04 MED ORDER — FENTANYL CITRATE 0.05 MG/ML IJ SOLN
INTRAMUSCULAR | Status: AC
  Filled 2014-09-04: qty 5

## 2014-09-04 MED ORDER — ONDANSETRON HCL 4 MG/2ML IJ SOLN: 4.0000 mg | Freq: Four times a day (QID) | INTRAMUSCULAR | Status: DC | PRN

## 2014-09-04 MED ORDER — DOCUSATE SODIUM 100 MG PO CAPS
100.0000 mg | ORAL_CAPSULE | Freq: Two times a day (BID) | ORAL | Status: DC
  Administered 2014-09-05 – 2014-09-06 (×3): 100 mg via ORAL
  Filled 2014-09-04 (×4): qty 1

## 2014-09-04 MED ORDER — PHENYLEPHRINE HCL 10 MG/ML IJ SOLN
INTRAMUSCULAR | Status: DC | PRN
  Administered 2014-09-04: 120 ug via INTRAVENOUS
  Administered 2014-09-04: 160 ug via INTRAVENOUS
  Administered 2014-09-04: 120 ug via INTRAVENOUS
  Administered 2014-09-04: 160 ug via INTRAVENOUS
  Administered 2014-09-04: 80 ug via INTRAVENOUS
  Administered 2014-09-04 (×2): 120 ug via INTRAVENOUS

## 2014-09-04 MED ORDER — METOCLOPRAMIDE HCL 10 MG PO TABS: 5.0000 mg | ORAL_TABLET | Freq: Three times a day (TID) | ORAL | Status: DC | PRN

## 2014-09-04 MED ORDER — PHENYLEPHRINE HCL 10 MG/ML IJ SOLN
INTRAMUSCULAR | Status: AC
  Filled 2014-09-04: qty 1

## 2014-09-04 MED ORDER — PROPOFOL 10 MG/ML IV BOLUS
INTRAVENOUS | Status: DC | PRN
  Administered 2014-09-04: 200 mg via INTRAVENOUS

## 2014-09-04 MED ORDER — LACTATED RINGERS IV SOLN
INTRAVENOUS | Status: DC
  Administered 2014-09-04: 19:00:00 via INTRAVENOUS

## 2014-09-04 MED ORDER — BISACODYL 10 MG RE SUPP
10.0000 mg | Freq: Every day | RECTAL | Status: DC | PRN
  Administered 2014-09-07: 10 mg via RECTAL
  Filled 2014-09-04: qty 1

## 2014-09-04 MED ORDER — ONDANSETRON HCL 4 MG PO TABS: 4.0000 mg | ORAL_TABLET | Freq: Four times a day (QID) | ORAL | Status: DC | PRN

## 2014-09-04 MED ORDER — HYDROMORPHONE HCL 1 MG/ML IJ SOLN
INTRAMUSCULAR | Status: AC
  Filled 2014-09-04: qty 1

## 2014-09-04 MED ORDER — OXYCODONE HCL 5 MG PO TABS
5.0000 mg | ORAL_TABLET | Freq: Once | ORAL | Status: AC | PRN
  Administered 2014-09-04: 5 mg via ORAL

## 2014-09-04 MED ORDER — OXYCODONE HCL 5 MG/5ML PO SOLN: 5.0000 mg | Freq: Once | ORAL | Status: AC | PRN

## 2014-09-04 MED ORDER — EPHEDRINE SULFATE 50 MG/ML IJ SOLN
INTRAMUSCULAR | Status: DC | PRN
  Administered 2014-09-04: 40 mg via INTRAVENOUS
  Administered 2014-09-04 (×3): 10 mg via INTRAVENOUS
  Administered 2014-09-04 (×2): 25 mg via INTRAVENOUS
  Administered 2014-09-04: 10 mg via INTRAVENOUS
  Administered 2014-09-04: 20 mg via INTRAVENOUS
  Administered 2014-09-04: 10 mg via INTRAVENOUS

## 2014-09-04 MED ORDER — METHOCARBAMOL 500 MG PO TABS
500.0000 mg | ORAL_TABLET | Freq: Four times a day (QID) | ORAL | Status: DC | PRN
  Filled 2014-09-04: qty 1

## 2014-09-04 MED ORDER — LISINOPRIL-HYDROCHLOROTHIAZIDE 10-12.5 MG PO TABS: 1.0000 | ORAL_TABLET | Freq: Every day | ORAL | Status: DC

## 2014-09-04 MED ORDER — OXYCODONE HCL 5 MG PO TABS
ORAL_TABLET | ORAL | Status: AC
  Filled 2014-09-04: qty 1

## 2014-09-04 MED ORDER — POTASSIUM CHLORIDE IN NACL 20-0.45 MEQ/L-% IV SOLN
INTRAVENOUS | Status: DC
  Administered 2014-09-05 – 2014-09-06 (×2): via INTRAVENOUS
  Filled 2014-09-04 (×10): qty 1000

## 2014-09-04 MED ORDER — CHLORHEXIDINE GLUCONATE 0.12 % MT SOLN
15.0000 mL | Freq: Two times a day (BID) | OROMUCOSAL | Status: DC
  Filled 2014-09-04 (×4): qty 15

## 2014-09-04 MED ORDER — CEFAZOLIN SODIUM-DEXTROSE 2-3 GM-% IV SOLR
INTRAVENOUS | Status: DC | PRN
  Administered 2014-09-04: 2 g via INTRAVENOUS

## 2014-09-04 MED ORDER — SODIUM CHLORIDE 0.9 % IR SOLN
Status: DC | PRN
  Administered 2014-09-04: 1000 mL

## 2014-09-04 MED ORDER — POLYETHYLENE GLYCOL 3350 17 G PO PACK
17.0000 g | PACK | Freq: Every day | ORAL | Status: DC | PRN
  Filled 2014-09-04: qty 1

## 2014-09-04 MED ORDER — MIDAZOLAM HCL 5 MG/5ML IJ SOLN
INTRAMUSCULAR | Status: DC | PRN
  Administered 2014-09-04: 2 mg via INTRAVENOUS

## 2014-09-04 MED ORDER — LACTATED RINGERS IV SOLN
INTRAVENOUS | Status: DC | PRN
  Administered 2014-09-04 (×2): via INTRAVENOUS

## 2014-09-04 MED ORDER — PROMETHAZINE HCL 25 MG/ML IJ SOLN: 6.2500 mg | INTRAMUSCULAR | Status: DC | PRN

## 2014-09-04 MED ORDER — HYDROCHLOROTHIAZIDE 12.5 MG PO CAPS
12.5000 mg | ORAL_CAPSULE | Freq: Every day | ORAL | Status: DC
  Administered 2014-09-05 – 2014-09-09 (×5): 12.5 mg via ORAL
  Filled 2014-09-04 (×5): qty 1

## 2014-09-04 MED ORDER — EPHEDRINE SULFATE 50 MG/ML IJ SOLN
INTRAMUSCULAR | Status: AC
  Filled 2014-09-04: qty 1

## 2014-09-04 MED ORDER — LISINOPRIL 10 MG PO TABS
10.0000 mg | ORAL_TABLET | Freq: Every day | ORAL | Status: DC
  Administered 2014-09-05 – 2014-09-09 (×5): 10 mg via ORAL
  Filled 2014-09-04 (×5): qty 1

## 2014-09-04 MED ORDER — CEFAZOLIN SODIUM 1-5 GM-% IV SOLN
1.0000 g | Freq: Four times a day (QID) | INTRAVENOUS | Status: DC
  Administered 2014-09-05 – 2014-09-06 (×6): 1 g via INTRAVENOUS
  Filled 2014-09-04 (×10): qty 50

## 2014-09-04 MED ORDER — METOCLOPRAMIDE HCL 5 MG/ML IJ SOLN
5.0000 mg | Freq: Three times a day (TID) | INTRAMUSCULAR | Status: DC | PRN
  Filled 2014-09-04: qty 2

## 2014-09-04 MED ORDER — OXYCODONE-ACETAMINOPHEN 5-325 MG PO TABS
1.0000 | ORAL_TABLET | ORAL | Status: DC | PRN
  Administered 2014-09-05 (×3): 2 via ORAL
  Filled 2014-09-04 (×3): qty 2

## 2014-09-04 MED ORDER — SODIUM CHLORIDE 0.9 % IJ SOLN
INTRAMUSCULAR | Status: AC
  Filled 2014-09-04: qty 10

## 2014-09-04 MED ORDER — ALBUMIN HUMAN 5 % IV SOLN
INTRAVENOUS | Status: DC | PRN
  Administered 2014-09-04: 20:00:00 via INTRAVENOUS

## 2014-09-04 MED ORDER — METHOCARBAMOL 1000 MG/10ML IJ SOLN
500.0000 mg | Freq: Four times a day (QID) | INTRAVENOUS | Status: DC | PRN
  Filled 2014-09-04: qty 5

## 2014-09-04 MED ORDER — GLYCOPYRROLATE 0.2 MG/ML IJ SOLN
INTRAMUSCULAR | Status: DC | PRN
  Administered 2014-09-04: 0.2 mg via INTRAVENOUS

## 2014-09-04 MED ORDER — FENTANYL CITRATE 0.05 MG/ML IJ SOLN
INTRAMUSCULAR | Status: DC | PRN
  Administered 2014-09-04 (×2): 50 ug via INTRAVENOUS
  Administered 2014-09-04: 250 ug via INTRAVENOUS

## 2014-09-04 MED ORDER — PHENYLEPHRINE HCL 10 MG/ML IJ SOLN
10.0000 mg | INTRAVENOUS | Status: DC | PRN
  Administered 2014-09-04: 50 ug/min via INTRAVENOUS

## 2014-09-04 MED ORDER — DIPHENHYDRAMINE HCL 12.5 MG/5ML PO ELIX
12.5000 mg | ORAL_SOLUTION | ORAL | Status: DC | PRN
  Filled 2014-09-04: qty 10

## 2014-09-04 MED ORDER — HYDROMORPHONE HCL 1 MG/ML IJ SOLN
0.5000 mg | INTRAMUSCULAR | Status: DC | PRN
Start: 2014-09-04 — End: 2014-09-06
  Administered 2014-09-05 – 2014-09-06 (×5): 1 mg via INTRAVENOUS
  Filled 2014-09-04 (×5): qty 1

## 2014-09-04 MED ORDER — CETYLPYRIDINIUM CHLORIDE 0.05 % MT LIQD: 7.0000 mL | Freq: Two times a day (BID) | OROMUCOSAL | Status: DC

## 2014-09-04 MED ORDER — HYDROMORPHONE HCL 1 MG/ML IJ SOLN
0.2500 mg | INTRAMUSCULAR | Status: DC | PRN
  Administered 2014-09-04 (×2): 0.5 mg via INTRAVENOUS

## 2014-09-04 MED ORDER — LIDOCAINE HCL (CARDIAC) 20 MG/ML IV SOLN
INTRAVENOUS | Status: DC | PRN
  Administered 2014-09-04: 100 mg via INTRAVENOUS

## 2014-09-04 MED ORDER — OXYCODONE HCL 5 MG PO TABS
5.0000 mg | ORAL_TABLET | ORAL | Status: DC | PRN
  Administered 2014-09-05 – 2014-09-09 (×7): 10 mg via ORAL
  Filled 2014-09-04 (×8): qty 2

## 2014-09-04 MED ORDER — SENNA 8.6 MG PO TABS
1.0000 | ORAL_TABLET | Freq: Two times a day (BID) | ORAL | Status: DC
  Administered 2014-09-05 – 2014-09-09 (×9): 8.6 mg via ORAL
  Filled 2014-09-04 (×11): qty 1

## 2014-09-04 MED ORDER — INSULIN ASPART 100 UNIT/ML ~~LOC~~ SOLN
0.0000 [IU] | Freq: Three times a day (TID) | SUBCUTANEOUS | Status: DC
  Administered 2014-09-05 – 2014-09-07 (×4): 2 [IU] via SUBCUTANEOUS
  Administered 2014-09-07: 5 [IU] via SUBCUTANEOUS
  Administered 2014-09-08 (×2): 3 [IU] via SUBCUTANEOUS
  Administered 2014-09-09: 5 [IU] via SUBCUTANEOUS
  Administered 2014-09-09 (×2): 2 [IU] via SUBCUTANEOUS

## 2014-09-04 MED ORDER — CALCIUM CHLORIDE 10 % IV SOLN
INTRAVENOUS | Status: AC
  Filled 2014-09-04: qty 10

## 2014-09-04 SURGICAL SUPPLY — 42 items
BANDAGE ELASTIC 4 VELCRO ST LF (GAUZE/BANDAGES/DRESSINGS) ×2 IMPLANT
BANDAGE ELASTIC 6 VELCRO ST LF (GAUZE/BANDAGES/DRESSINGS) ×2 IMPLANT
BIT DRILL CANN 2.7X625 NONSTRL (BIT) ×2 IMPLANT
BLADE SURG 10 STRL SS (BLADE) ×2 IMPLANT
BNDG COHESIVE 4X5 TAN STRL (GAUZE/BANDAGES/DRESSINGS) ×2 IMPLANT
BNDG GAUZE ELAST 4 BULKY (GAUZE/BANDAGES/DRESSINGS) ×2 IMPLANT
BOOTCOVER CLEANROOM LRG (PROTECTIVE WEAR) ×4 IMPLANT
COVER SURGICAL LIGHT HANDLE (MISCELLANEOUS) ×2 IMPLANT
CUFF TOURNIQUET SINGLE 34IN LL (TOURNIQUET CUFF) IMPLANT
DRAPE INCISE IOBAN 66X45 STRL (DRAPES) ×2 IMPLANT
DRAPE OEC MINIVIEW 54X84 (DRAPES) ×2 IMPLANT
DRSG VAC ATS LRG SENSATRAC (GAUZE/BANDAGES/DRESSINGS) IMPLANT
DRSG VAC ATS MED SENSATRAC (GAUZE/BANDAGES/DRESSINGS) ×2 IMPLANT
DURAPREP 26ML APPLICATOR (WOUND CARE) ×2 IMPLANT
ELECT REM PT RETURN 9FT ADLT (ELECTROSURGICAL)
ELECTRODE REM PT RTRN 9FT ADLT (ELECTROSURGICAL) IMPLANT
GAUZE SPONGE 4X4 12PLY STRL (GAUZE/BANDAGES/DRESSINGS) ×2 IMPLANT
GAUZE XEROFORM 1X8 LF (GAUZE/BANDAGES/DRESSINGS) ×2 IMPLANT
GLOVE BIOGEL PI ORTHO PRO SZ8 (GLOVE) ×1
GLOVE ORTHO TXT STRL SZ7.5 (GLOVE) ×2 IMPLANT
GLOVE PI ORTHO PRO STRL SZ8 (GLOVE) ×1 IMPLANT
GLOVE SURG ORTHO 8.0 STRL STRW (GLOVE) ×4 IMPLANT
GOWN STRL REUS W/ TWL LRG LVL3 (GOWN DISPOSABLE) IMPLANT
GOWN STRL REUS W/TWL LRG LVL3 (GOWN DISPOSABLE)
GUIDEWIRE THREADED 150MM (WIRE) ×6 IMPLANT
KIT BASIN OR (CUSTOM PROCEDURE TRAY) ×2 IMPLANT
KIT ROOM TURNOVER OR (KITS) ×2 IMPLANT
MANIFOLD NEPTUNE II (INSTRUMENTS) ×2 IMPLANT
NS IRRIG 1000ML POUR BTL (IV SOLUTION) ×2 IMPLANT
PACK ORTHO EXTREMITY (CUSTOM PROCEDURE TRAY) ×2 IMPLANT
PAD ARMBOARD 7.5X6 YLW CONV (MISCELLANEOUS) ×4 IMPLANT
PAD NEG PRESSURE SENSATRAC (MISCELLANEOUS) IMPLANT
SCREW CANN S THRD/38 4.0 (Screw) ×2 IMPLANT
SCREW CANN S THRD/44 4.0 (Screw) ×2 IMPLANT
SPONGE LAP 18X18 X RAY DECT (DISPOSABLE) ×2 IMPLANT
STOCKINETTE IMPERVIOUS 9X36 MD (GAUZE/BANDAGES/DRESSINGS) ×2 IMPLANT
TOWEL OR 17X24 6PK STRL BLUE (TOWEL DISPOSABLE) ×2 IMPLANT
TOWEL OR 17X26 10 PK STRL BLUE (TOWEL DISPOSABLE) ×2 IMPLANT
TUBE CONNECTING 12X1/4 (SUCTIONS) ×2 IMPLANT
UNDERPAD 30X30 INCONTINENT (UNDERPADS AND DIAPERS) ×2 IMPLANT
WATER STERILE IRR 1000ML POUR (IV SOLUTION) ×2 IMPLANT
YANKAUER SUCT BULB TIP NO VENT (SUCTIONS) ×2 IMPLANT

## 2014-09-04 NOTE — Transfer of Care (Signed)
Immediate Anesthesia Transfer of Care Note  Patient: Marc Taylor  Procedure(s) Performed: Procedure(s): Right Foot FASCIOTOMY and wound vac placement (Right) OPEN REDUCTION INTERNAL FIXATION (ORIF) FOOT LISFRANC FRACTURE (Right)  Patient Location: PACU  Anesthesia Type:General  Level of Consciousness: awake, alert  and oriented  Airway & Oxygen Therapy: Patient Spontanous Breathing and Patient connected to nasal cannula oxygen  Post-op Assessment: Report given to PACU RN, Post -op Vital signs reviewed and stable and Patient moving all extremities  Post vital signs: Reviewed and stable  Complications: No apparent anesthesia complications

## 2014-09-04 NOTE — Anesthesia Postprocedure Evaluation (Signed)
  Anesthesia Post-op Note  Patient: Marc Taylor  Procedure(s) Performed: Procedure(s): Right Foot FASCIOTOMY and wound vac placement (Right) OPEN REDUCTION INTERNAL FIXATION (ORIF) FOOT LISFRANC FRACTURE (Right)  Patient Location: PACU  Anesthesia Type:General  Level of Consciousness: awake, alert  and oriented  Airway and Oxygen Therapy: Patient Spontanous Breathing  Post-op Pain: none  Post-op Assessment: Post-op Vital signs reviewed  Post-op Vital Signs: Reviewed  Last Vitals:  Filed Vitals:   09/04/14 2100  BP: 130/51  Pulse: 95  Temp:   Resp: 23    Complications: No apparent anesthesia complications

## 2014-09-04 NOTE — Progress Notes (Signed)
Report taken from Robyn, RN.

## 2014-09-04 NOTE — Anesthesia Procedure Notes (Signed)
Procedure Name: LMA Insertion Date/Time: 09/04/2014 7:22 PM Performed by: Orvilla FusATO, Danahi Reddish A Pre-anesthesia Checklist: Patient identified, Timeout performed, Emergency Drugs available, Suction available and Patient being monitored Patient Re-evaluated:Patient Re-evaluated prior to inductionOxygen Delivery Method: Circle system utilized Preoxygenation: Pre-oxygenation with 100% oxygen Intubation Type: IV induction Ventilation: Mask ventilation without difficulty LMA: LMA inserted LMA Size: 5.0 Number of attempts: 1 Placement Confirmation: positive ETCO2 and breath sounds checked- equal and bilateral Tube secured with: Tape Dental Injury: Teeth and Oropharynx as per pre-operative assessment

## 2014-09-04 NOTE — Anesthesia Preprocedure Evaluation (Addendum)
Anesthesia Evaluation  Patient identified by MRN, date of birth, ID band Patient awake    Reviewed: Allergy & Precautions, H&P , NPO status , Patient's Chart, lab work & pertinent test results  Airway Mallampati: I TM Distance: >3 FB Neck ROM: Full    Dental  (+) Teeth Intact, Dental Advisory Given   Pulmonary neg pulmonary ROS,  breath sounds clear to auscultation        Cardiovascular hypertension, Pt. on medications Rhythm:Regular Rate:Normal     Neuro/Psych negative neurological ROS  negative psych ROS   GI/Hepatic negative GI ROS, Neg liver ROS,   Endo/Other  diabetes, Type 2, Insulin Dependent, Oral Hypoglycemic AgentsMorbid obesity  Renal/GU negative Renal ROS     Musculoskeletal negative musculoskeletal ROS (+)   Abdominal   Peds  Hematology negative hematology ROS (+)   Anesthesia Other Findings   Reproductive/Obstetrics                          Anesthesia Physical Anesthesia Plan  ASA: III  Anesthesia Plan: General   Post-op Pain Management:    Induction: Intravenous  Airway Management Planned: LMA  Additional Equipment:   Intra-op Plan:   Post-operative Plan:   Informed Consent: I have reviewed the patients History and Physical, chart, labs and discussed the procedure including the risks, benefits and alternatives for the proposed anesthesia with the patient or authorized representative who has indicated his/her understanding and acceptance.   Dental advisory given  Plan Discussed with: CRNA and Surgeon  Anesthesia Plan Comments:         Anesthesia Quick Evaluation

## 2014-09-04 NOTE — Progress Notes (Signed)
Report given to Robin RN
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
## 2014-09-05 ENCOUNTER — Other Ambulatory Visit (HOSPITAL_COMMUNITY): Payer: Self-pay | Admitting: Orthopedic Surgery

## 2014-09-05 LAB — COMPREHENSIVE METABOLIC PANEL
ALT: 14 U/L (ref 0–53)
AST: 20 U/L (ref 0–37)
Albumin: 3.1 g/dL — ABNORMAL LOW (ref 3.5–5.2)
Alkaline Phosphatase: 58 U/L (ref 39–117)
Anion gap: 10 (ref 5–15)
BUN: 13 mg/dL (ref 6–23)
CO2: 26 mEq/L (ref 19–32)
Calcium: 8.4 mg/dL (ref 8.4–10.5)
Chloride: 99 mEq/L (ref 96–112)
Creatinine, Ser: 0.93 mg/dL (ref 0.50–1.35)
GFR calc Af Amer: >90 mL/min (ref >90)
GFR calc non Af Amer: >90 mL/min (ref >90)
Glucose, Bld: 111 mg/dL — ABNORMAL HIGH (ref 70–99)
Potassium: 3.9 mEq/L (ref 3.7–5.3)
Sodium: 135 mEq/L — ABNORMAL LOW (ref 137–147)
Total Bilirubin: 0.5 mg/dL (ref 0.3–1.2)
Total Protein: 6.3 g/dL (ref 6.0–8.3)

## 2014-09-05 LAB — SURGICAL PCR SCREEN
MRSA, PCR: NEGATIVE
Staphylococcus aureus: NEGATIVE

## 2014-09-05 LAB — GLUCOSE, CAPILLARY
Glucose-Capillary: 78 mg/dL (ref 70–99)
Glucose-Capillary: 111 mg/dL — ABNORMAL HIGH (ref 70–99)
Glucose-Capillary: 136 mg/dL — ABNORMAL HIGH (ref 70–99)
Glucose-Capillary: 124 mg/dL — ABNORMAL HIGH (ref 70–99)
Glucose-Capillary: 150 mg/dL — ABNORMAL HIGH (ref 70–99)

## 2014-09-05 MED ORDER — DEXTROSE 5 % IV SOLN
3.0000 g | INTRAVENOUS | Status: AC
  Administered 2014-09-06: 3 g via INTRAVENOUS
  Filled 2014-09-05: qty 3000

## 2014-09-05 MED ORDER — FENTANYL CITRATE 0.05 MG/ML IJ SOLN
INTRAMUSCULAR | Status: AC
  Filled 2014-09-05: qty 5

## 2014-09-05 MED ORDER — PROPOFOL 10 MG/ML IV BOLUS
INTRAVENOUS | Status: AC
  Filled 2014-09-05: qty 20

## 2014-09-05 MED ORDER — SUCCINYLCHOLINE CHLORIDE 20 MG/ML IJ SOLN
INTRAMUSCULAR | Status: AC
  Filled 2014-09-05: qty 1

## 2014-09-05 MED ORDER — MIDAZOLAM HCL 2 MG/2ML IJ SOLN
INTRAMUSCULAR | Status: AC
  Filled 2014-09-05: qty 2

## 2014-09-05 MED ORDER — CHLORHEXIDINE GLUCONATE 4 % EX LIQD
60.0000 mL | Freq: Once | CUTANEOUS | Status: DC
  Filled 2014-09-05: qty 60

## 2014-09-05 NOTE — Progress Notes (Signed)
Utilization review completed.  

## 2014-09-05 NOTE — Progress Notes (Signed)
Patient ID: Marc Taylor, male   DOB: 09/13/1961, 53 y.o.   MRN: [id number] Postoperative day 1 status post right foot compartment releases as well as open reduction internal fixation of the Lisfranc fracture dislocation. Patient had significant amount of liquefied muscle interosseous. Patient states that his foot is much more comfortable this morning. There is decrease swelling. Will plan for surgery tomorrow with repeat irrigation and debridement possible wound closures, replacement of the VAC, possible skin graft.  N.p.o. after midnight. 

## 2014-09-05 NOTE — Progress Notes (Signed)
Occupational Therapy Evaluation and Discharge Patient Details Name: Marc [name] MRN: [id number] DOB: 1961/07/06 Today's Date: 09/05/2014    History of Present Illness Patient is a 53 y/o male s/p right foot fasciotomy with wound vac placement and ORIF right foot Lisfranc fx dislocation 10/21. PMH of  HTN and DM. Planned for surgery tomorrow (10/23) with repeat irrigation and debridement possible wound closures, replacement of the VAC, possible skin graft.  NWB RLE.   Clinical Impression   PTA pt lived at home and was independent with ADLs.  Pt reports that his girlfriend will assist with ADLs and has been helping him take sponge baths in bed. Encouraged pt to be as independent as possible. Pt with good mobility, however slightly impulsive. Educated on fall prevention and importance of NWB status. Pt reports no further ADL concerns. Acute OT to sign off.    Follow Up Recommendations  No OT follow up;Supervision - Intermittent (OOB/Mobility)    Equipment Recommendations  None recommended by OT    Recommendations for Other Services       Precautions / Restrictions Precautions Precautions: Fall Precaution Comments: Wound vac Restrictions Weight Bearing Restrictions: Yes RLE Weight Bearing: Non weight bearing      Mobility Bed Mobility               General bed mobility comments: Pt sitting up in recliner.  Transfers Overall transfer level: Needs assistance Equipment used: Rolling walker (2 wheeled) Transfers: Sit to/from Stand Sit to Stand: Supervision         General transfer comment: Supervision for safety due to lines/wound vac. Compliant with NWB status RLE.    Balance Overall balance assessment: Needs assistance Sitting-balance support: No upper extremity supported;Feet supported Sitting balance-Leahy Scale: Good     Standing balance support: No upper extremity supported;During functional activity Standing balance-Leahy Scale: Fair Standing balance  comment: Pt able to stand for periods without UE support while maintaining NWB however requires UE support during mobility.                            ADL Overall ADL's : Needs assistance/impaired Eating/Feeding: Independent;Sitting   Grooming: Supervision/safety;Standing   Upper Body Bathing: Set up;Sitting   Lower Body Bathing: Set up;Sit to/from stand Lower Body Bathing Details (indicate cue type and reason): Lt foot only Upper Body Dressing : Set up;Sitting   Lower Body Dressing: Set up;Sit to/from stand Lower Body Dressing Details (indicate cue type and reason): Lt foot only Toilet Transfer: Supervision/safety;Ambulation;RW           Functional mobility during ADLs: Supervision/safety;Rolling walker General ADL Comments: Pt moving well, however slightly impulsive. Pt reports that his girlfriend has been giving him bed baths and he will likely return to bathing this way. Encouraged pt to be as independent as possible. Encouraged pt to continue sponge bathing while sitting on BSC at sink and performing sit<>stand with Supervision for perineal care. Pt reports that he has no ADL concerns.     Vision  Pt reports no change from baseline.                    Perception Perception Perception Tested?: No   Praxis Praxis Praxis tested?: Within functional limits    Pertinent Vitals/Pain Pain Assessment: 0-10 Pain Score: 5  Pain Location: Rt foot Pain Descriptors / Indicators: Sore Pain Intervention(s): Monitored during session     Hand Dominance     Extremity/Trunk Assessment Upper  Extremity Assessment Upper Extremity Assessment: Overall WFL for tasks assessed   Lower Extremity Assessment Lower Extremity Assessment: Defer to PT evaluation   Cervical / Trunk Assessment Cervical / Trunk Assessment: Normal   Communication Communication Communication: No difficulties   Cognition Arousal/Alertness: Awake/alert Behavior During Therapy: WFL for tasks  assessed/performed Overall Cognitive Status: Within Functional Limits for tasks assessed                                Home Living Family/patient expects to be discharged to:: Private residence Living Arrangements: Spouse/significant other (girlfriend) Available Help at Discharge: Family;Available PRN/intermittently (GF works 4:30-1:30) Type of Home: House Home Access: Level entry     Home Layout: Two level;Bed/bath upstairs Alternate Level Stairs-Number of Steps: 1 flight Alternate Level Stairs-Rails: Right Bathroom Shower/Tub: Tub/shower unit;Walk-in shower Shower/tub characteristics: Engineer, building servicesCurtain Bathroom Toilet: Standard     Home Equipment: Crutches;Bedside commode          Prior Functioning/Environment Level of Independence: Independent        Comments: [place] for the [place]    OT Diagnosis: Generalized weakness;Acute pain                          End of Session Equipment Utilized During Treatment: Rolling walker  Activity Tolerance: Patient tolerated treatment well Patient left: in chair;with call bell/phone within reach;with family/visitor present   Time: [id number] OT Time Calculation (min): 15 min Charges:  OT General Charges $OT Visit: 1 Procedure OT Evaluation $Initial OT Evaluation Tier I: 1 Procedure OT Treatments $Self Care/Home Management : 8-22 mins  Rae LipsMiller, Brunilda Eble M 09/05/2014, 2:28 PM  Carney LivingLeeAnn Marie Magdelena Kinsella, OTR/L Occupational Therapist [phone] (pager)

## 2014-09-05 NOTE — Progress Notes (Signed)
Patient admitted to unit via bed to 5W26. Pt alert and oriented x 4, in no distress. Without any complaints. Significant other at bedside. IV intact. Surgical incision placed with wound vac to right foot without any complaints. Pt states pain level "2" on scale of "10". Incentive spirometer provided to patient, pt able to demonstrate use and do teach back. Right foot elevated. Will continue to monitor patient per MD orders.

## 2014-09-05 NOTE — Evaluation (Signed)
Physical Therapy Evaluation Patient Details Name: Marc Taylor MRN: [id number] DOB: January 07, 1961 Today's Date: 09/05/2014   History of Present Illness  Patient is a 53 y/o male s/p right foot fasciotomy with wound vac placement and ORIF right foot Lisfranc fx dislocation 10/21. PMH of  HTN and DM. Planned for surgery tomorrow (10/23) with repeat irrigation and debridement possible wound closures, replacement of the VAC, possible skin graft.  NWB RLE.   Clinical Impression  Patient presents with functional limitations due to deficits listed in PT problem list (see below). Pt with impaired functional mobility due to NWB status RLE, impaired endurance and pain. Pt compliant with WB status during gait training however slightly impulsive requiring cues to slow down for safety. Planned for OR tomorrow. Pt would benefit from acute PT to improve transfers, gait, balance, stairs and overall mobility so pt can maximize independence and return to PLOF. Will need to assess safety with gait using crutches next session and ability to negotiate steps as pt wants to return home.    Follow Up Recommendations Home health PT;Supervision/Assistance - 24 hour    Equipment Recommendations  Other (comment) (TBD, may need RW.)    Recommendations for Other Services OT consult     Precautions / Restrictions Precautions Precautions: Fall Precaution Comments: Wound vac Restrictions Weight Bearing Restrictions: Yes RLE Weight Bearing: Non weight bearing      Mobility  Bed Mobility Overal bed mobility: Needs Assistance Bed Mobility: Supine to Sit     Supine to sit: Modified independent (Device/Increase time);HOB elevated     General bed mobility comments: Use of rails.  Transfers Overall transfer level: Needs assistance Equipment used: Rolling walker (2 wheeled) Transfers: Sit to/from Stand Sit to Stand: Supervision         General transfer comment: Supervision for safety due to lines/wound vac.  Compliant with NWB status RLE.  Ambulation/Gait Ambulation/Gait assistance: Min guard Ambulation Distance (Feet): 18 Feet Assistive device: Rolling walker (2 wheeled) Gait Pattern/deviations: Trunk flexed     General Gait Details: Demonstrates hopping gait pattern with increased WB through BUEs. VC to decrease gait speed as pt mildly impulsive with mobility with concern for safety. Compliant with WB status during gait training.   Stairs            Wheelchair Mobility    Modified Rankin (Stroke Patients Only)       Balance Overall balance assessment: Needs assistance   Sitting balance-Leahy Scale: Good     Standing balance support: During functional activity Standing balance-Leahy Scale: Fair Standing balance comment: Able to stand for short periods without UE support while maintaining NWb RLE however requires UE support during mobility due to NWB status and balance.                             Pertinent Vitals/Pain Pain Assessment: 0-10 Pain Score: 2  Pain Location: right foot Pain Descriptors / Indicators: Sore Pain Intervention(s): Monitored during session;Premedicated before session;Repositioned    Home Living Family/patient expects to be discharged to:: Private residence Living Arrangements: Other (Comment) Available Help at Discharge: Family;Available PRN/intermittently (Gf works from 4:30 am-1:30 pm) Type of Home: House Home Access: Level entry     Home Layout: Two level;Bed/bath upstairs Home Equipment: Crutches;Bedside commode      Prior Function Level of Independence: Independent         Comments: [place] the [place]     Hand Dominance  Extremity/Trunk Assessment   Upper Extremity Assessment: Defer to OT evaluation           Lower Extremity Assessment: RLE deficits/detail RLE Deficits / Details: Edema present RLE distal to knee and into foot, more laterally. Reports diminished light touch sensation.  Able to minimally move R ankle, all other joints AROM WFL.       Communication   Communication: No difficulties  Cognition Arousal/Alertness: Awake/alert Behavior During Therapy: WFL for tasks assessed/performed Overall Cognitive Status: Within Functional Limits for tasks assessed                      General Comments General comments (skin integrity, edema, etc.): Discussed disposition with pt and g/f as pt would like to return home. G/f reports living space can be setup on main level if needed.     Exercises        Assessment/Plan    PT Assessment Patient needs continued PT services  PT Diagnosis Difficulty walking;Acute pain   PT Problem List Pain;Decreased strength;Impaired sensation;Decreased activity tolerance;Decreased balance;Decreased safety awareness;Decreased mobility  PT Treatment Interventions Balance training;Gait training;DME instruction;Stair training;Functional mobility training;Patient/family education;Therapeutic activities   PT Goals (Current goals can be found in the Care Plan section) Acute Rehab PT Goals Patient Stated Goal: to return to independence. PT Goal Formulation: With patient Time For Goal Achievement: 09/19/14 Potential to Achieve Goals: Good    Frequency Min 3X/week   Barriers to discharge Decreased caregiver support;Inaccessible home environment Pt will be home alone from 4:30-1:30 pm. 1 flight of steps to get to bedroom.    Co-evaluation               End of Session   Activity Tolerance: Patient tolerated treatment well Patient left: in chair;with call bell/phone within reach;with family/visitor present Nurse Communication: Mobility status;Weight bearing status         Time: [id number] PT Time Calculation (min): 23 min   Charges:   PT Evaluation $Initial PT Evaluation Tier I: 1 Procedure PT Treatments $Therapeutic Activity: 8-22 mins   PT G CodesAlvie Heidelberg:          Folan, Raena Pau A 09/05/2014, 10:26 AM Alvie HeidelbergShauna  Folan, PT, DPT [phone]

## 2014-09-06 ENCOUNTER — Encounter (HOSPITAL_COMMUNITY): Payer: Self-pay | Admitting: Orthopedic Surgery

## 2014-09-06 ENCOUNTER — Inpatient Hospital Stay (HOSPITAL_COMMUNITY): Payer: Worker's Compensation | Admitting: Certified Registered"

## 2014-09-06 ENCOUNTER — Encounter (HOSPITAL_COMMUNITY)
Admission: RE | Disposition: A | Discharge status: Home Health | Payer: Self-pay | Source: Ambulatory Visit | Attending: Orthopedic Surgery

## 2014-09-06 ENCOUNTER — Encounter (HOSPITAL_COMMUNITY): Payer: Worker's Compensation | Admitting: Certified Registered"

## 2014-09-06 HISTORY — PX: I & D EXTREMITY: SHX5045

## 2014-09-06 LAB — GLUCOSE, CAPILLARY
Glucose-Capillary: 137 mg/dL — ABNORMAL HIGH (ref 70–99)
Glucose-Capillary: 140 mg/dL — ABNORMAL HIGH (ref 70–99)
Glucose-Capillary: 123 mg/dL — ABNORMAL HIGH (ref 70–99)
Glucose-Capillary: 130 mg/dL — ABNORMAL HIGH (ref 70–99)
Glucose-Capillary: 117 mg/dL — ABNORMAL HIGH (ref 70–99)
Glucose-Capillary: 170 mg/dL — ABNORMAL HIGH (ref 70–99)

## 2014-09-06 LAB — COMPREHENSIVE METABOLIC PANEL
ALT: 16 U/L (ref 0–53)
AST: 25 U/L (ref 0–37)
Albumin: 3.1 g/dL — ABNORMAL LOW (ref 3.5–5.2)
Alkaline Phosphatase: 68 U/L (ref 39–117)
Anion gap: 10 (ref 5–15)
BUN: 11 mg/dL (ref 6–23)
CO2: 27 mEq/L (ref 19–32)
Calcium: 8.6 mg/dL (ref 8.4–10.5)
Chloride: 100 mEq/L (ref 96–112)
Creatinine, Ser: 0.82 mg/dL (ref 0.50–1.35)
GFR calc Af Amer: >90 mL/min (ref >90)
GFR calc non Af Amer: >90 mL/min (ref >90)
Glucose, Bld: 130 mg/dL — ABNORMAL HIGH (ref 70–99)
Potassium: 4.2 mEq/L (ref 3.7–5.3)
Sodium: 137 mEq/L (ref 137–147)
Total Bilirubin: 0.4 mg/dL (ref 0.3–1.2)
Total Protein: 6.5 g/dL (ref 6.0–8.3)

## 2014-09-06 SURGERY — IRRIGATION AND DEBRIDEMENT EXTREMITY
Anesthesia: General | Site: Foot | Laterality: Right

## 2014-09-06 MED ORDER — MIDAZOLAM HCL 5 MG/5ML IJ SOLN
INTRAMUSCULAR | Status: DC | PRN
  Administered 2014-09-06: 2 mg via INTRAVENOUS

## 2014-09-06 MED ORDER — OXYCODONE-ACETAMINOPHEN 5-325 MG PO TABS
1.0000 | ORAL_TABLET | ORAL | Status: DC | PRN
  Administered 2014-09-07: 1 via ORAL
  Administered 2014-09-08 – 2014-09-09 (×7): 2 via ORAL
  Filled 2014-09-06 (×6): qty 2
  Filled 2014-09-06: qty 1
  Filled 2014-09-06 (×2): qty 2

## 2014-09-06 MED ORDER — PROPOFOL 10 MG/ML IV BOLUS
INTRAVENOUS | Status: DC | PRN
  Administered 2014-09-06: 200 mg via INTRAVENOUS

## 2014-09-06 MED ORDER — HYDROMORPHONE HCL 1 MG/ML IJ SOLN
INTRAMUSCULAR | Status: DC | PRN
  Administered 2014-09-06: 1 mg via INTRAVENOUS

## 2014-09-06 MED ORDER — CEFAZOLIN SODIUM 1-5 GM-% IV SOLN
1.0000 g | Freq: Four times a day (QID) | INTRAVENOUS | Status: AC
  Administered 2014-09-06 – 2014-09-08 (×9): 1 g via INTRAVENOUS
  Filled 2014-09-06 (×10): qty 50

## 2014-09-06 MED ORDER — PROPOFOL 10 MG/ML IV BOLUS
INTRAVENOUS | Status: AC
  Filled 2014-09-06: qty 20

## 2014-09-06 MED ORDER — LACTATED RINGERS IV SOLN
INTRAVENOUS | Status: DC | PRN
  Administered 2014-09-06: 13:00:00 via INTRAVENOUS

## 2014-09-06 MED ORDER — DOCUSATE SODIUM 100 MG PO CAPS
100.0000 mg | ORAL_CAPSULE | Freq: Two times a day (BID) | ORAL | Status: DC
  Administered 2014-09-06 – 2014-09-09 (×6): 100 mg via ORAL
  Filled 2014-09-06 (×6): qty 1

## 2014-09-06 MED ORDER — HYDROMORPHONE HCL 1 MG/ML IJ SOLN
INTRAMUSCULAR | Status: AC
  Filled 2014-09-06: qty 1

## 2014-09-06 MED ORDER — METHOCARBAMOL 1000 MG/10ML IJ SOLN
500.0000 mg | Freq: Four times a day (QID) | INTRAVENOUS | Status: DC | PRN
  Filled 2014-09-06: qty 5

## 2014-09-06 MED ORDER — LIDOCAINE HCL (CARDIAC) 20 MG/ML IV SOLN
INTRAVENOUS | Status: DC | PRN
  Administered 2014-09-06: 60 mg via INTRAVENOUS

## 2014-09-06 MED ORDER — OXYCODONE HCL 5 MG PO TABS: 5.0000 mg | ORAL_TABLET | Freq: Once | ORAL | Status: DC | PRN

## 2014-09-06 MED ORDER — SODIUM CHLORIDE 0.9 % IR SOLN
Status: DC | PRN
  Administered 2014-09-06: 1000 mL

## 2014-09-06 MED ORDER — FENTANYL CITRATE 0.05 MG/ML IJ SOLN
INTRAMUSCULAR | Status: DC | PRN
  Administered 2014-09-06: 150 ug via INTRAVENOUS
  Administered 2014-09-06: 100 ug via INTRAVENOUS

## 2014-09-06 MED ORDER — OXYCODONE HCL 5 MG/5ML PO SOLN: 5.0000 mg | Freq: Once | ORAL | Status: DC | PRN

## 2014-09-06 MED ORDER — METOCLOPRAMIDE HCL 5 MG/ML IJ SOLN
5.0000 mg | Freq: Three times a day (TID) | INTRAMUSCULAR | Status: DC | PRN
  Filled 2014-09-06: qty 2

## 2014-09-06 MED ORDER — HYDROMORPHONE HCL 1 MG/ML IJ SOLN
0.2500 mg | INTRAMUSCULAR | Status: DC | PRN
  Administered 2014-09-06: 1 mg via INTRAVENOUS

## 2014-09-06 MED ORDER — MIDAZOLAM HCL 2 MG/2ML IJ SOLN
INTRAMUSCULAR | Status: AC
  Filled 2014-09-06: qty 2

## 2014-09-06 MED ORDER — METOCLOPRAMIDE HCL 10 MG PO TABS: 5.0000 mg | ORAL_TABLET | Freq: Three times a day (TID) | ORAL | Status: DC | PRN

## 2014-09-06 MED ORDER — ONDANSETRON HCL 4 MG PO TABS: 4.0000 mg | ORAL_TABLET | Freq: Four times a day (QID) | ORAL | Status: DC | PRN

## 2014-09-06 MED ORDER — METHOCARBAMOL 500 MG PO TABS
500.0000 mg | ORAL_TABLET | Freq: Four times a day (QID) | ORAL | Status: DC | PRN
  Administered 2014-09-06 – 2014-09-07 (×2): 500 mg via ORAL
  Filled 2014-09-06 (×2): qty 1

## 2014-09-06 MED ORDER — SODIUM CHLORIDE 0.9 % IV SOLN
INTRAVENOUS | Status: DC
  Administered 2014-09-06: 17:00:00 via INTRAVENOUS

## 2014-09-06 MED ORDER — FENTANYL CITRATE 0.05 MG/ML IJ SOLN: 100.0000 ug | Freq: Once | INTRAMUSCULAR | Status: DC

## 2014-09-06 MED ORDER — ONDANSETRON HCL 4 MG/2ML IJ SOLN: 4.0000 mg | Freq: Four times a day (QID) | INTRAMUSCULAR | Status: DC | PRN

## 2014-09-06 MED ORDER — FENTANYL CITRATE 0.05 MG/ML IJ SOLN
INTRAMUSCULAR | Status: AC
  Administered 2014-09-06: 100 ug
  Filled 2014-09-06: qty 2

## 2014-09-06 MED ORDER — ASPIRIN EC 325 MG PO TBEC
325.0000 mg | DELAYED_RELEASE_TABLET | Freq: Every day | ORAL | Status: DC
  Administered 2014-09-06 – 2014-09-09 (×4): 325 mg via ORAL
  Filled 2014-09-06 (×4): qty 1

## 2014-09-06 MED ORDER — FENTANYL CITRATE 0.05 MG/ML IJ SOLN
INTRAMUSCULAR | Status: AC
  Filled 2014-09-06: qty 5

## 2014-09-06 MED ORDER — ONDANSETRON HCL 4 MG/2ML IJ SOLN
INTRAMUSCULAR | Status: DC | PRN
  Administered 2014-09-06: 4 mg via INTRAVENOUS

## 2014-09-06 MED ORDER — HYDROMORPHONE HCL 1 MG/ML IJ SOLN
0.5000 mg | INTRAMUSCULAR | Status: DC | PRN
  Administered 2014-09-06 – 2014-09-07 (×6): 1 mg via INTRAVENOUS
  Filled 2014-09-06 (×7): qty 1

## 2014-09-06 SURGICAL SUPPLY — 42 items
BLADE SURG 10 STRL SS (BLADE) IMPLANT
BNDG COHESIVE 4X5 TAN STRL (GAUZE/BANDAGES/DRESSINGS) IMPLANT
BNDG COHESIVE 6X5 TAN STRL LF (GAUZE/BANDAGES/DRESSINGS) IMPLANT
CANISTER WOUND CARE 500ML ATS (WOUND CARE) ×2 IMPLANT
COVER SURGICAL LIGHT HANDLE (MISCELLANEOUS) ×2 IMPLANT
CUFF TOURNIQUET SINGLE 18IN (TOURNIQUET CUFF) IMPLANT
CUFF TOURNIQUET SINGLE 24IN (TOURNIQUET CUFF) IMPLANT
CUFF TOURNIQUET SINGLE 34IN LL (TOURNIQUET CUFF) IMPLANT
CUFF TOURNIQUET SINGLE 44IN (TOURNIQUET CUFF) IMPLANT
DRAPE U-SHAPE 47X51 STRL (DRAPES) ×2 IMPLANT
DRSG ADAPTIC 3X8 NADH LF (GAUZE/BANDAGES/DRESSINGS) ×2 IMPLANT
DRSG VAC ATS MED SENSATRAC (GAUZE/BANDAGES/DRESSINGS) ×2 IMPLANT
DURAPREP 26ML APPLICATOR (WOUND CARE) ×2 IMPLANT
ELECT CAUTERY BLADE 6.4 (BLADE) IMPLANT
ELECT REM PT RETURN 9FT ADLT (ELECTROSURGICAL)
ELECTRODE REM PT RTRN 9FT ADLT (ELECTROSURGICAL) IMPLANT
GAUZE SPONGE 4X4 12PLY STRL (GAUZE/BANDAGES/DRESSINGS) ×2 IMPLANT
GLOVE BIOGEL PI IND STRL 9 (GLOVE) ×1 IMPLANT
GLOVE BIOGEL PI INDICATOR 9 (GLOVE) ×1
GLOVE SURG ORTHO 9.0 STRL STRW (GLOVE) ×2 IMPLANT
GOWN STRL REUS W/ TWL XL LVL3 (GOWN DISPOSABLE) ×2 IMPLANT
GOWN STRL REUS W/TWL XL LVL3 (GOWN DISPOSABLE) ×2
HANDPIECE INTERPULSE COAX TIP (DISPOSABLE)
KIT BASIN OR (CUSTOM PROCEDURE TRAY) ×2 IMPLANT
KIT ROOM TURNOVER OR (KITS) ×2 IMPLANT
MANIFOLD NEPTUNE II (INSTRUMENTS) ×2 IMPLANT
NS IRRIG 1000ML POUR BTL (IV SOLUTION) ×2 IMPLANT
PACK ORTHO EXTREMITY (CUSTOM PROCEDURE TRAY) ×2 IMPLANT
PAD ARMBOARD 7.5X6 YLW CONV (MISCELLANEOUS) ×4 IMPLANT
PADDING CAST COTTON 6X4 STRL (CAST SUPPLIES) ×2 IMPLANT
SET HNDPC FAN SPRY TIP SCT (DISPOSABLE) IMPLANT
SPONGE LAP 18X18 X RAY DECT (DISPOSABLE) ×2 IMPLANT
STOCKINETTE IMPERVIOUS 9X36 MD (GAUZE/BANDAGES/DRESSINGS) IMPLANT
TISSUE THERASKIN 1X2 (Tissue) ×2 IMPLANT
TISSUE THERASKIN 2X3 (Tissue) ×2 IMPLANT
TOWEL OR 17X24 6PK STRL BLUE (TOWEL DISPOSABLE) ×2 IMPLANT
TOWEL OR 17X26 10 PK STRL BLUE (TOWEL DISPOSABLE) ×2 IMPLANT
TUBE ANAEROBIC SPECIMEN COL (MISCELLANEOUS) IMPLANT
TUBE CONNECTING 12X1/4 (SUCTIONS) ×2 IMPLANT
UNDERPAD 30X30 INCONTINENT (UNDERPADS AND DIAPERS) ×2 IMPLANT
WATER STERILE IRR 1000ML POUR (IV SOLUTION) ×2 IMPLANT
YANKAUER SUCT BULB TIP NO VENT (SUCTIONS) ×2 IMPLANT

## 2014-09-06 NOTE — H&P (View-Only) (Signed)
Patient ID: Marc [name], male   DOB: March 08, 1961, 53 y.o.   MRN: [id number] Postoperative Taylor 1 status post right foot compartment releases as well as open reduction internal fixation of the Lisfranc fracture dislocation. Patient had significant amount of liquefied muscle interosseous. Patient states that his foot is much more comfortable this morning. There is decrease swelling. Will plan for surgery tomorrow with repeat irrigation and debridement possible wound closures, replacement of the VAC, possible skin graft.  N.p.o. after midnight.

## 2014-09-06 NOTE — Op Note (Signed)
09/04/2014 - 09/06/2014  5:18 PM  PATIENT:  Marc Taylor    PRE-OPERATIVE DIAGNOSIS:  Compartment Syndrome Right Foot  POST-OPERATIVE DIAGNOSIS:  Same  PROCEDURE:  Irrigation and Debridement Right Foot, excision of skin soft tissue and muscle. Application of TheraSkin graft x2 one unit 2 x 3". The second unit 1 x 2". Wound vac removal and application of a new wound VAC surface area 2 x 20 cm  SURGEON:  [name],[name], MD  PHYSICIAN ASSISTANT:None ANESTHESIA:   General  PREOPERATIVE INDICATIONS:  Marc Taylor is a  53 y.o. male with a diagnosis of Compartment Syndrome Right Foot who failed conservative measures and elected for surgical management.    The risks benefits and alternatives were discussed with the patient preoperatively including but not limited to the risks of infection, bleeding, nerve injury, cardiopulmonary complications, the need for revision surgery, among others, and the patient was willing to proceed.  OPERATIVE IMPLANTS: None  OPERATIVE FINDINGS: Progressive ischemic muscle changes deep within the midfoot and forefoot.  OPERATIVE PROCEDURE: Patient was brought to the operating room and underwent a general anesthetic. After adequate levels of anesthesia were obtained patient's foot was prepped using Betadine paint and Betadine scrub and draped into a sterile field. A rongeur was used to debride skin soft tissue and necrotic muscle. The wound was irrigated with pulsatile lavage. There was good capillary refill in the toes. The TheraSkin was applied to the open wounds and stabilized with staples. This was covered with Mepitel. A wound VAC was applied to both wounds. This was set to 75 mm of suction. Covered with Ioban. Patient was extubated taken to the PACU in stable condition.

## 2014-09-06 NOTE — Op Note (Deleted)
09/04/2014 - 09/06/2014  4:21 PM  PATIENT:  Marc Taylor    PRE-OPERATIVE DIAGNOSIS:  Compartment Syndrome Right Foot  POST-OPERATIVE DIAGNOSIS:  Same  PROCEDURE:  Irrigation and Debridement Right Foot, TheraSkin graft and wound vac change out  SURGEON:  Nadara MustardUDA,Farrah Skoda V, MD  PHYSICIAN ASSISTANT:None ANESTHESIA:   General  PREOPERATIVE INDICATIONS:  Marc DayGeorge Rodda is a  53 y.o. male with a diagnosis of Compartment Syndrome Right Foot who failed conservative measures and elected for surgical management.    The risks benefits and alternatives were discussed with the patient preoperatively including but not limited to the risks of infection, bleeding, nerve injury, cardiopulmonary complications, the need for revision surgery, among others, and the patient was willing to proceed.  OPERATIVE IMPLANTS: None  OPERATIVE FINDINGS: Progressive ischemic muscle which was debrided  OPERATIVE PROCEDURE: Patient was brought to the operating room and underwent a general anesthetic. After adequate levels of anesthesia were obtained patient's right lower extremity was prepped using Betadine paint and scrub and draped into a sterile field. Examination showed a progressive necrotic tissue deep within the foot. This was debrided with a Ronjair. The wound was irrigated with pulsatile lavage. Patient did have good capillary refill in the toes. The patient still had significant swelling and despite using the wound VAC the fasciotomy sites could not be closed primarily. Allograft skin graft was then used to close the 2 wound sites 1 skin graft was 2 x 3" the other skin graft was 1 x 2". The skin graft was applied and held in place with staples. A Mepitel dressing was applied followed by a wound VAC. The wound VAC was sealed with Ioban dressing. This had a good suction fit and 75 mm of mercury. Patient was extubated taken to the PACU in stable condition.

## 2014-09-06 NOTE — Transfer of Care (Signed)
Immediate Anesthesia Transfer of Care Note  Patient: Marc Taylor  Procedure(s) Performed: Procedure(s): Irrigation and Debridement Right Foot, TheraSkin graft and wound vac change out (Right)  Patient Location: PACU  Anesthesia Type:General  Level of Consciousness: awake, alert  and oriented  Airway & Oxygen Therapy: Patient Spontanous Breathing and Patient connected to nasal cannula oxygen  Post-op Assessment: Report given to PACU RN and Post -op Vital signs reviewed and stable  Post vital signs: Reviewed and stable  Complications: No apparent anesthesia complications

## 2014-09-06 NOTE — Plan of Care (Signed)
Problem: Phase I Progression Outcomes Goal: OOB as tolerated unless otherwise ordered Patient up as tolerated using front wheel walker, RLE NWB.

## 2014-09-06 NOTE — Anesthesia Postprocedure Evaluation (Signed)
  Anesthesia Post-op Note  Patient: Marc [name]  Procedure(s) Performed: Procedure(s): Irrigation and Debridement Right Foot, TheraSkin graft and wound vac change out (Right)  Patient Location: PACU  Anesthesia Type:General  Level of Consciousness: awake and alert   Airway and Oxygen Therapy: Patient Spontanous Breathing  Post-op Pain: none  Post-op Assessment: Post-op Vital signs reviewed, Patient's Cardiovascular Status Stable and Respiratory Function Stable  Post-op Vital Signs: Reviewed  Filed Vitals:   09/06/14 1506  BP: 117/59  Pulse: 84  Temp: 37.2 C  Resp: 10    Complications: No apparent anesthesia complications

## 2014-09-06 NOTE — Interval H&P Note (Signed)
History and Physical Interval Note:  09/06/2014 6:37 AM  [name]  has presented today for surgery, with the diagnosis of Compartment Syndrome Right Foot  The various methods of treatment have been discussed with the patient and family. After consideration of risks, benefits and other options for treatment, the patient has consented to  Procedure(s): Irrigation and Debridement Right Foot, Possible Wound Closure, Possible Skin Graft (Right) as a surgical intervention .  The patient's history has been reviewed, patient examined, no change in status, stable for surgery.  I have reviewed the patient's chart and labs.  Questions were answered to the patient's satisfaction.     [name],[name]

## 2014-09-06 NOTE — Progress Notes (Signed)
Returned from PACU, a/ox4, instructed on usage of ICS. Denies nausea, c/o right foot pain 4/10.

## 2014-09-06 NOTE — Anesthesia Preprocedure Evaluation (Addendum)
Anesthesia Evaluation  Patient identified by MRN, date of birth, ID band Patient awake    Reviewed: Allergy & Precautions, H&P , NPO status , Patient's Chart, lab work & pertinent test results  Airway Mallampati: II TM Distance: >3 FB Neck ROM: Full    Dental no notable dental hx. (+) Teeth Intact, Dental Advisory Given   Pulmonary neg pulmonary ROS,  breath sounds clear to auscultation  Pulmonary exam normal       Cardiovascular hypertension, Pt. on medications Rhythm:Regular Rate:Normal     Neuro/Psych negative neurological ROS  negative psych ROS   GI/Hepatic negative GI ROS, Neg liver ROS,   Endo/Other  diabetes, Type 2, Oral Hypoglycemic AgentsMorbid obesity  Renal/GU negative Renal ROS  negative genitourinary   Musculoskeletal   Abdominal   Peds  Hematology negative hematology ROS (+)   Anesthesia Other Findings   Reproductive/Obstetrics negative OB ROS                          Anesthesia Physical Anesthesia Plan  ASA: III  Anesthesia Plan: General   Post-op Pain Management:    Induction: Intravenous  Airway Management Planned: LMA  Additional Equipment:   Intra-op Plan:   Post-operative Plan: Extubation in OR  Informed Consent: I have reviewed the patients History and Physical, chart, labs and discussed the procedure including the risks, benefits and alternatives for the proposed anesthesia with the patient or authorized representative who has indicated his/her understanding and acceptance.   Dental advisory given  Plan Discussed with: CRNA  Anesthesia Plan Comments:         Anesthesia Quick Evaluation

## 2014-09-06 NOTE — Anesthesia Procedure Notes (Signed)
Procedure Name: LMA Insertion Date/Time: 09/06/2014 1:43 PM Performed by: Arlice ColtMANESS, Cypress Hinkson B Pre-anesthesia Checklist: Patient identified, Emergency Drugs available, Suction available, Patient being monitored and Timeout performed Patient Re-evaluated:Patient Re-evaluated prior to inductionOxygen Delivery Method: Circle system utilized Preoxygenation: Pre-oxygenation with 100% oxygen Intubation Type: IV induction LMA: LMA inserted LMA Size: 5.0 Number of attempts: 1 Tube secured with: Tape Dental Injury: Teeth and Oropharynx as per pre-operative assessment

## 2014-09-07 LAB — COMPREHENSIVE METABOLIC PANEL
ALT: 18 U/L (ref 0–53)
AST: 27 U/L (ref 0–37)
Albumin: 3 g/dL — ABNORMAL LOW (ref 3.5–5.2)
Alkaline Phosphatase: 86 U/L (ref 39–117)
Anion gap: 10 (ref 5–15)
BUN: 15 mg/dL (ref 6–23)
CO2: 26 mEq/L (ref 19–32)
Calcium: 8.9 mg/dL (ref 8.4–10.5)
Chloride: 99 mEq/L (ref 96–112)
Creatinine, Ser: 0.93 mg/dL (ref 0.50–1.35)
GFR calc Af Amer: >90 mL/min (ref >90)
GFR calc non Af Amer: >90 mL/min (ref >90)
Glucose, Bld: 158 mg/dL — ABNORMAL HIGH (ref 70–99)
Potassium: 4.6 mEq/L (ref 3.7–5.3)
Sodium: 135 mEq/L — ABNORMAL LOW (ref 137–147)
Total Bilirubin: 0.6 mg/dL (ref 0.3–1.2)
Total Protein: 7 g/dL (ref 6.0–8.3)

## 2014-09-07 LAB — GLUCOSE, CAPILLARY
Glucose-Capillary: 207 mg/dL — ABNORMAL HIGH (ref 70–99)
Glucose-Capillary: 121 mg/dL — ABNORMAL HIGH (ref 70–99)
Glucose-Capillary: 128 mg/dL — ABNORMAL HIGH (ref 70–99)
Glucose-Capillary: 180 mg/dL — ABNORMAL HIGH (ref 70–99)

## 2014-09-07 NOTE — Progress Notes (Signed)
Physical Therapy Treatment Patient Details Name: Marc Taylor MRN: [id number] DOB: [birth year] Today's Date: 09/07/2014    History of Present Illness Patient is a 53 y/o male s/p right foot fasciotomy with wound vac placement and ORIF right foot Lisfranc fx dislocation 10/21. PMH of  HTN and DM. Another surgery 10/23 with repeat irrigation and debridement possible wound closures, replacement of the VAC, skin graft.  NWB RLE.    PT Comments    Pt admitted with above. Pt currently with functional limitations due to balance and endurance deficits.  Pain is pt's greatest limiting factor to progression.  Pt toes burning after his walk.  Nursing aware.  Pt will benefit from skilled PT to increase their independence and safety with mobility to allow discharge to the venue listed below.  Follow Up Recommendations  Home health PT;Supervision/Assistance - 24 hour     Equipment Recommendations  Rolling walker with 5" wheels    Recommendations for Other Services       Precautions / Restrictions Precautions Precautions: Fall Precaution Comments: Wound vac Restrictions Weight Bearing Restrictions: Yes RLE Weight Bearing: Non weight bearing    Mobility  Bed Mobility   Bed Mobility: Supine to Sit     Supine to sit: Modified independent (Device/Increase time);HOB elevated        Transfers Overall transfer level: Needs assistance Equipment used: Rolling walker (2 wheeled) Transfers: Sit to/from Stand Sit to Stand: Supervision         General transfer comment: Supervision for safety due to lines/wound vac. Compliant with NWB status RLE.  Ambulation/Gait Ambulation/Gait assistance: Min assist Ambulation Distance (Feet): 85 Feet Assistive device: Rolling walker (2 wheeled) Gait Pattern/deviations: Step-to pattern;Decreased stride length;Trunk flexed   Gait velocity interpretation: Below normal speed for age/gender General Gait Details: Demonstrates hopping gait pattern with  increased WB through BUEs. VC to decrease gait speed as pt mildly impulsive with mobility with concern for safety. Compliant with WB status during gait training. At times gets off balance due to rushing and stepping too close to front of RW.     Stairs            Wheelchair Mobility    Modified Rankin (Stroke Patients Only)       Balance Overall balance assessment: Needs assistance;History of Falls         Standing balance support: Bilateral upper extremity supported;During functional activity Standing balance-Leahy Scale: Poor Standing balance comment: Requires UE support for standing.                      Cognition Arousal/Alertness: Awake/alert Behavior During Therapy: WFL for tasks assessed/performed Overall Cognitive Status: Within Functional Limits for tasks assessed                      Exercises      General Comments General comments (skin integrity, edema, etc.): Discussed staying with use of RW instead of crutches due to pt pain making pt have decr balance toward end of walk.       Pertinent Vitals/Pain Pain Assessment: 0-10 Pain Score: 10-Worst pain ever Pain Location: right foot after ambulation Pain Descriptors / Indicators: Burning;Stabbing Pain Intervention(s): Limited activity within patient's tolerance;Monitored during session;Repositioned;Patient requesting pain meds-RN notified (RN brought IV med before PT left room) VSS    Home Living                      Prior Function  PT Goals (current goals can now be found in the care plan section) Progress towards PT goals: Progressing toward goals    Frequency  Min 3X/week    PT Plan Current plan remains appropriate    Co-evaluation             End of Session Equipment Utilized During Treatment: Gait belt Activity Tolerance: Patient limited by pain Patient left: in bed;with call bell/phone within reach;with family/visitor present     Time:  1441-1511 PT Time Calculation (min): 30 min  Charges:  $Gait Training: 8-22 mins $Therapeutic Activity: 8-22 mins                    G CodesTawni Millers:      [name] 09/07/2014, 6:00 PM [employer] CorporationDawn Raenell Mensing,PT Acute Rehabilitation [phone] (pager)

## 2014-09-07 NOTE — Progress Notes (Signed)
Patient ID: Marc Taylor, male   DOB: February 08, 1961, 53 y.o.   MRN: [id number] Wound VAC functioning well. Will plan to change the sponge on Monday and evaluate for discharge with continued wound VAC sponge. Continue with elevation nonweightbearing right lower extremity

## 2014-09-08 LAB — GLUCOSE, CAPILLARY
Glucose-Capillary: 155 mg/dL — ABNORMAL HIGH (ref 70–99)
Glucose-Capillary: 117 mg/dL — ABNORMAL HIGH (ref 70–99)
Glucose-Capillary: 169 mg/dL — ABNORMAL HIGH (ref 70–99)

## 2014-09-08 NOTE — Progress Notes (Signed)
Patient ID: [name], male   DOB: 1961/02/08, 53 y.o.   MRN: [id number] Foot still swollen, good drainage in VAC, change VAC in AM

## 2014-09-09 LAB — GLUCOSE, CAPILLARY
Glucose-Capillary: 152 mg/dL — ABNORMAL HIGH (ref 70–99)
Glucose-Capillary: 142 mg/dL — ABNORMAL HIGH (ref 70–99)
Glucose-Capillary: 127 mg/dL — ABNORMAL HIGH (ref 70–99)
Glucose-Capillary: 203 mg/dL — ABNORMAL HIGH (ref 70–99)

## 2014-09-09 MED ORDER — DOXYCYCLINE HYCLATE 50 MG PO CAPS: 100.0000 mg | ORAL_CAPSULE | Freq: Two times a day (BID) | ORAL | Status: DC

## 2014-09-09 MED ORDER — ASPIRIN EC 325 MG PO TBEC: 325.0000 mg | DELAYED_RELEASE_TABLET | Freq: Every day | ORAL | Status: AC

## 2014-09-09 MED ORDER — OXYCODONE-ACETAMINOPHEN 5-325 MG PO TABS: 1.0000 | ORAL_TABLET | ORAL | Status: DC | PRN

## 2014-09-09 NOTE — Progress Notes (Signed)
Patient ID: Marc Taylor, male   DOB: 1961-10-22, 53 y.o.   MRN: [id number] I will remove VAC at 5pm and plan for D/C at that time

## 2014-09-09 NOTE — Care Management Note (Signed)
    Page 1 of 2   09/09/2014     6:58:11 PM CARE MANAGEMENT NOTE 09/09/2014  Patient:  Marc Taylor,Marc Taylor   Account Number:  [id number]  Date Initiated:  09/06/2014  Documentation initiated by:  Letha CapeAYLOR,Cordai Rodrigue  Subjective/Objective Assessment:   dx right foot  admit- lives with girlfriend.  Patient is underwormans comp this admit.     Action/Plan:   Anticipated DC Date:  09/09/2014   Anticipated DC Plan:  HOME W HOME HEALTH SERVICES      DC Planning Services  CM consult      [facility]  HOME HEALTH   Choice offered to / List presented to:  C-1 Patient   DME arranged  [facility]an HurstWALKER - ROLLING      DME agency  [facility]     [facility]  [facility] RN  HH[facility]2 PT      [facility]  OTHER - SEE NOTE   Status of service:  Completed, signed off Medicare Important Message given?  NO (If response is "NO", the following Medicare IM given date fields will be blank) Date Medicare IM given:   Medicare IM given by:   Date Additional Medicare IM given:   Additional Medicare IM given by:    Discharge Disposition:  HOME W HOME HEALTH SERVICES  Per UR Regulation:  Reviewed for med. necessity/level of care/duration of stay  If discussed at Long Length of Stay Meetings, dates discussed:    Comments:  09/09/14 1222 Letha Capeeborah [employer] RN, BSN [phone] patient is for dc today, NCM is working with Joy[employer] the [employer] rep to get rolling walker thr [employer]  and HHRN and HHPT for patient is.  She is going through [employer]  company to get this approved.  Patient is not going home with vac per MD note , it is being removed at 5 pm today.  Alona BeneJoyce will let me know what time the walker will be here.  Patient will have a family member take scripts over to pharmacy for him hopefully before 5pm so he will not have a probem getting his meds paid for by wormens comp.  Per Dr. Lajoyce Cornersuda he will not be able to get here til after 5 pm and he will write the scipts then.  DC summary faxed to [employer] rep.  Rolling walker was brought up to patient's room.  Alona BeneJoyce will let NCM know the [facility] that is working with patient .

## 2014-09-09 NOTE — Discharge Summary (Signed)
Physician Discharge Summary  Patient ID: Marc Taylor MRN: [id number] DOB/AGE: [birth year] 53 y.o.  Admit date: 09/04/2014 Discharge date: 09/09/2014  Admission Diagnoses: Compartment syndrome right foot secondary to crush injury with transverse Lisfranc fracture dislocation  Discharge Diagnoses: Same Principal Problem:   Compartment syndrome of right foot Active Problems:   Fracture of fifth toe, right, closed   Compartment syndrome of foot   Discharged Condition: stable  Hospital Course: Patient's hospital course was essentially unremarkable. He underwent compartment releases as well as open reduction internal fixation for the Lisfranc fracture dislocation. Postoperatively patient underwent wound VAC therapy underwent repeat irrigation and debridement placement of skin graft and read application of the wound VAC. The wound VAC was removed there was good granulation tissue the skin graft was intact. Patient still had ischemic changes to his foot. Patient was discharged to home in stable condition.  Consults: None  Significant Diagnostic Studies: labs: Routine labs  Tresurgery: Surgery 2 T  Discharge Exam: Blood pressure 115/63, pulse 87, temperature 98.2 F (36.8 C), temperature source Oral, resp. rate 16, height 5\' 9"  (1.753 m), weight 123 kg (271 lb 2.7 oz), SpO2 94.00%. Incision/Wound: stable healthy skin graft and ischemic changes to the foot at discharge  Disposition: 01-Home or Self Care  Discharge Instructions   Call MD / Call 911    Complete by:  As directed   If you experience chest pain or shortness of breath, CALL 911 and be transported to the hospital emergency room.  If you develope a fever above 101 F, pus (white drainage) or increased drainage or redness at the wound, or calf pain, call your surgeon's office.     Constipation Prevention    Complete by:  As directed   Drink plenty of fluids.  Prune juice may be helpful.  You may use a stool softener, such as  Colace (over the counter) 100 mg twice a day.  Use MiraLax (over the counter) for constipation as needed.     Diet - low sodium heart healthy    Complete by:  As directed      Elevate operative extremity    Complete by:  As directed      For home use only DME Crutches    Complete by:  As directed      Increase activity slowly as tolerated    Complete by:  As directed      Non weight bearing    Complete by:  As directed   Laterality:  right  Extremity:  Lower     Post-op shoe    Complete by:  As directed             Medication List         aspirin EC 325 MG tablet  Take 1 tablet (325 mg total) by mouth daily.     CINNAMON PO  Take 2 tablets by mouth daily.     doxycycline 50 MG capsule  Commonly known as:  VIBRAMYCIN  Take 2 capsules (100 mg total) by mouth 2 (two) times daily.     fish oil-omega-3 fatty acids 1000 MG capsule  Take 2 g by mouth daily.     GARLIC PO  Take 2 tablets by mouth daily.     lisinopril-hydrochlorothiazide 10-12.5 MG per tablet  Commonly known as:  PRINZIDE,ZESTORETIC  Take 1 tablet by mouth daily.     metFORMIN 500 MG tablet  Commonly known as:  GLUCOPHAGE  Take 500 mg by mouth 2 (two) times  daily with a meal.     oxyCODONE-acetaminophen 5-325 MG per tablet  Commonly known as:  ROXICET  Take 1 tablet by mouth every 4 (four) hours as needed for severe pain.           Follow-up Information   Follow up with Taylor,Marc V, MD In 1 week.   Specialty:  Orthopedic Surgery   Contact information:   [address] [place] [address] [phone]       Signed: Nadara MustardDUDA,Marc V 09/09/2014, 6:15 PM

## 2014-09-09 NOTE — Progress Notes (Signed)
NURSING PROGRESS NOTE  Marc [name] [id number] Discharge Data: 09/09/2014 7:03 PM Attending Provider: [name], MD BJY:NWGNFAO,ZHYQMVPCP:EHINGER,ROBERT R, MD     Marc [name] to be D/C'd Home per MD order.  Discussed with the patient the After Visit Summary and all questions fully answered. All IV's discontinued with no bleeding noted. All belongings returned to patient for patient to take home.   Last Vital Signs:  Blood pressure 115/63, pulse 87, temperature 98.2 F (36.8 C), temperature source Oral, resp. rate 16, height 5\' 9"  (1.753 m), weight 123 kg (271 lb 2.7 oz), SpO2 94.00%.  Discharge Medication List   Medication List         aspirin EC 325 MG tablet  Take 1 tablet (325 mg total) by mouth daily.     CINNAMON PO  Take 2 tablets by mouth daily.     doxycycline 50 MG capsule  Commonly known as:  VIBRAMYCIN  Take 2 capsules (100 mg total) by mouth 2 (two) times daily.     fish oil-omega-3 fatty acids 1000 MG capsule  Take 2 g by mouth daily.     GARLIC PO  Take 2 tablets by mouth daily.     lisinopril-hydrochlorothiazide 10-12.5 MG per tablet  Commonly known as:  PRINZIDE,ZESTORETIC  Take 1 tablet by mouth daily.     metFORMIN 500 MG tablet  Commonly known as:  GLUCOPHAGE  Take 500 mg by mouth 2 (two) times daily with a meal.     oxyCODONE-acetaminophen 5-325 MG per tablet  Commonly known as:  ROXICET  Take 1 tablet by mouth every 4 (four) hours as needed for severe pain.         Cathlyn Parsonsattha Jyren Cerasoli, RN

## 2014-09-09 NOTE — Progress Notes (Signed)
Orthopedic Tech Progress Note Patient Details:  Marc [name] 08-05-1961 [id number]  Ortho Devices Type of Ortho Device: Postop shoe/boot Ortho Device/Splint Interventions: Ordered;Application   Jennye MoccasinHughes, [name] 09/09/2014, 8:06 PM

## 2014-09-09 NOTE — Progress Notes (Signed)
Physical Therapy Treatment Patient Details Name: Marc Taylor MRN: [id number] DOB: 1961/05/08 Today's Date: 09/09/2014    History of Present Illness Patient is a 53 y/o male s/p right foot fasciotomy with wound vac placement and ORIF right foot Lisfranc fx dislocation 10/21. PMH of  HTN and DM. Another surgery 10/23 with repeat irrigation and debridement possible wound closures, replacement of the VAC, skin graft.  NWB RLE.    PT Comments    Patient slowly progressing towards goals. Pt with limited activity tolerance due to increased pain in right foot when in dependent position or when ambulating. Ambulation distance self limited by patient due to pain and impaired endurance. Education provided on safe stair negotiation technique when discharged home and importance of short bouts of exercise to prevent DVTs and detrimental affects of bedrest. Pt to have wound vac removed later today. Will continue to follow and progress as tolerated.   Follow Up Recommendations  Home health PT;Supervision/Assistance - 24 hour     Equipment Recommendations  Rolling walker with 5" wheels    Recommendations for Other Services       Precautions / Restrictions Precautions Precautions: Fall Precaution Comments: Wound vac Restrictions Weight Bearing Restrictions: Yes RLE Weight Bearing: Non weight bearing    Mobility  Bed Mobility Overal bed mobility: Needs Assistance Bed Mobility: Supine to Sit;Sit to Supine     Supine to sit: Modified independent (Device/Increase time);HOB elevated Sit to supine: Modified independent (Device/Increase time);HOB elevated      Transfers Overall transfer level: Needs assistance Equipment used: Rolling walker (2 wheeled) Transfers: Sit to/from Stand Sit to Stand: Supervision         General transfer comment: Supervision for safety due to lines/wound vac. Compliant with NWB status RLE.  Ambulation/Gait Ambulation/Gait assistance: Min guard Ambulation  Distance (Feet): 32 Feet Assistive device: Rolling walker (2 wheeled) Gait Pattern/deviations: Step-to pattern;Decreased stride length;Trunk flexed   Gait velocity interpretation: Below normal speed for age/gender General Gait Details: Demonstrates hopping gait pattern with increased WB through BUEs. VC to decrease gait speed for safety. Compliant with NWB status during gait training. Pt gets off balance at times due to rushing and stepping too close to front of RW.     Stairs            Wheelchair Mobility    Modified Rankin (Stroke Patients Only)       Balance Overall balance assessment: Needs assistance;History of Falls   Sitting balance-Leahy Scale: Good     Standing balance support: During functional activity;Bilateral upper extremity supported   Standing balance comment: Requires BUE support for standing/gait.                    Cognition Arousal/Alertness: Awake/alert Behavior During Therapy: WFL for tasks assessed/performed Overall Cognitive Status: Within Functional Limits for tasks assessed                      Exercises      General Comments        Pertinent Vitals/Pain Pain Assessment: 0-10 Pain Score: 8  Pain Location: right foot Pain Descriptors / Indicators: Sore;Aching;Stabbing ("it would be like if i punched you upside the temple.") Pain Intervention(s): Limited activity within patient's tolerance;Monitored during session;Repositioned;Patient requesting pain meds-RN notified    Home Living                      Prior Function  PT Goals (current goals can now be found in the care plan section) Progress towards PT goals: Progressing toward goals    Frequency  Min 3X/week    PT Plan Current plan remains appropriate    Co-evaluation             End of Session Equipment Utilized During Treatment: Gait belt Activity Tolerance: Patient limited by pain Patient left: in bed;with call bell/phone  within reach;with bed alarm set;with family/visitor present     Time: 1610-96041544-1604 PT Time Calculation (min): 20 min  Charges:  $Gait Training: 8-22 mins                    G CodesAlvie Heidelberg:      Folan, Alyissa Whidbee A 09/09/2014, 4:12 PM [name], PT, DPT [phone]

## 2014-09-10 ENCOUNTER — Encounter (HOSPITAL_COMMUNITY): Payer: Self-pay | Admitting: Orthopedic Surgery

## 2014-09-17 ENCOUNTER — Encounter (HOSPITAL_COMMUNITY): Payer: Self-pay | Admitting: *Deleted

## 2014-09-17 ENCOUNTER — Other Ambulatory Visit (HOSPITAL_COMMUNITY): Payer: Self-pay | Admitting: Orthopedic Surgery

## 2014-09-17 NOTE — Progress Notes (Signed)
   09/17/14 1944  OBSTRUCTIVE SLEEP APNEA  Have you ever been diagnosed with sleep apnea through a sleep study? No  Do you snore loudly (loud enough to be heard through closed doors)?  0  Do you often feel tired, fatigued, or sleepy during the daytime? 0  Has anyone observed you stop breathing during your sleep? 0  Do you have, or are you being treated for high blood pressure? 1  BMI more than 35 kg/m2? 1  Age over 52 years old? 1  Neck circumference greater than 40 cm/16 inches? 1 (17.5)  Gender: 1  Obstructive Sleep Apnea Score 5  Score 4 or greater  Results sent to PCP

## 2014-09-18 ENCOUNTER — Inpatient Hospital Stay (HOSPITAL_COMMUNITY)
Admission: RE | Admit: 2014-09-18 | Discharge: 2014-09-23 | DRG: 855 | Disposition: A | Discharge status: Home Health | Payer: Worker's Compensation | Source: Ambulatory Visit | Attending: Orthopedic Surgery | Admitting: Orthopedic Surgery

## 2014-09-18 ENCOUNTER — Encounter (HOSPITAL_COMMUNITY): Payer: Self-pay | Admitting: *Deleted

## 2014-09-18 ENCOUNTER — Encounter (HOSPITAL_COMMUNITY)
Admission: RE | Disposition: A | Discharge status: Home Health | Payer: Self-pay | Source: Ambulatory Visit | Attending: Orthopedic Surgery

## 2014-09-18 ENCOUNTER — Inpatient Hospital Stay (HOSPITAL_COMMUNITY): Payer: Worker's Compensation | Admitting: Anesthesiology

## 2014-09-18 DIAGNOSIS — A480 Gas gangrene: Principal | ICD-10-CM | POA: Diagnosis present

## 2014-09-18 DIAGNOSIS — I96 Gangrene, not elsewhere classified: Secondary | ICD-10-CM | POA: Diagnosis present

## 2014-09-18 DIAGNOSIS — I10 Essential (primary) hypertension: Secondary | ICD-10-CM | POA: Diagnosis present

## 2014-09-18 DIAGNOSIS — X58XXXS Exposure to other specified factors, sequela: Secondary | ICD-10-CM | POA: Diagnosis present

## 2014-09-18 DIAGNOSIS — E1142 Type 2 diabetes mellitus with diabetic polyneuropathy: Secondary | ICD-10-CM | POA: Diagnosis present

## 2014-09-18 DIAGNOSIS — E119 Type 2 diabetes mellitus without complications: Secondary | ICD-10-CM | POA: Diagnosis present

## 2014-09-18 DIAGNOSIS — T79A21D Traumatic compartment syndrome of right lower extremity, subsequent encounter: Secondary | ICD-10-CM

## 2014-09-18 DIAGNOSIS — S9781XS Crushing injury of right foot, sequela: Secondary | ICD-10-CM | POA: Diagnosis not present

## 2014-09-18 DIAGNOSIS — K5900 Constipation, unspecified: Secondary | ICD-10-CM | POA: Diagnosis not present

## 2014-09-18 HISTORY — PX: AMPUTATION: SHX166

## 2014-09-18 LAB — COMPREHENSIVE METABOLIC PANEL
ALT: 36 U/L (ref 0–53)
AST: 21 U/L (ref 0–37)
Albumin: 2.8 g/dL — ABNORMAL LOW (ref 3.5–5.2)
Alkaline Phosphatase: 175 U/L — ABNORMAL HIGH (ref 39–117)
Anion gap: 13 (ref 5–15)
BUN: 21 mg/dL (ref 6–23)
CO2: 23 mEq/L (ref 19–32)
Calcium: 9.3 mg/dL (ref 8.4–10.5)
Chloride: 99 mEq/L (ref 96–112)
Creatinine, Ser: 0.83 mg/dL (ref 0.50–1.35)
GFR calc Af Amer: >90 mL/min (ref >90)
GFR calc non Af Amer: >90 mL/min (ref >90)
Glucose, Bld: 109 mg/dL — ABNORMAL HIGH (ref 70–99)
Potassium: 4.7 mEq/L (ref 3.7–5.3)
Sodium: 135 mEq/L — ABNORMAL LOW (ref 137–147)
Total Bilirubin: 0.3 mg/dL (ref 0.3–1.2)
Total Protein: 7.3 g/dL (ref 6.0–8.3)

## 2014-09-18 LAB — GLUCOSE, CAPILLARY
Glucose-Capillary: 112 mg/dL — ABNORMAL HIGH (ref 70–99)
Glucose-Capillary: 101 mg/dL — ABNORMAL HIGH (ref 70–99)
Glucose-Capillary: 129 mg/dL — ABNORMAL HIGH (ref 70–99)
Glucose-Capillary: 113 mg/dL — ABNORMAL HIGH (ref 70–99)
Glucose-Capillary: 129 mg/dL — ABNORMAL HIGH (ref 70–99)

## 2014-09-18 LAB — CBC
HCT: 33.7 % — ABNORMAL LOW (ref 39.0–52.0)
Hemoglobin: 11.5 g/dL — ABNORMAL LOW (ref 13.0–17.0)
MCH: 28.8 pg (ref 26.0–34.0)
MCHC: 34.1 g/dL (ref 30.0–36.0)
MCV: 84.5 fL (ref 78.0–100.0)
Platelets: 513 10*3/uL — ABNORMAL HIGH (ref 150–400)
RBC: 3.99 MIL/uL — ABNORMAL LOW (ref 4.22–5.81)
RDW: 13.1 % (ref 11.5–15.5)
WBC: 9.4 10*3/uL (ref 4.0–10.5)

## 2014-09-18 LAB — PROTIME-INR
INR: 1.05 (ref 0.00–1.49)
Prothrombin Time: 13.8 seconds (ref 11.6–15.2)

## 2014-09-18 LAB — APTT: aPTT: 30 seconds (ref 24–37)

## 2014-09-18 SURGERY — AMPUTATION, FOOT, PARTIAL
Anesthesia: General | Laterality: Right

## 2014-09-18 MED ORDER — PROPOFOL 10 MG/ML IV BOLUS
INTRAVENOUS | Status: AC
  Filled 2014-09-18: qty 20

## 2014-09-18 MED ORDER — FENTANYL CITRATE 0.05 MG/ML IJ SOLN
INTRAMUSCULAR | Status: AC
  Filled 2014-09-18: qty 5

## 2014-09-18 MED ORDER — DEXTROSE 5 % IV SOLN
3.0000 g | INTRAVENOUS | Status: AC
  Administered 2014-09-18: 3 g via INTRAVENOUS
  Filled 2014-09-18 (×2): qty 3000

## 2014-09-18 MED ORDER — METOCLOPRAMIDE HCL 5 MG PO TABS: 5.0000 mg | ORAL_TABLET | Freq: Three times a day (TID) | ORAL | Status: DC | PRN

## 2014-09-18 MED ORDER — ONDANSETRON HCL 4 MG/2ML IJ SOLN: 4.0000 mg | Freq: Four times a day (QID) | INTRAMUSCULAR | Status: DC | PRN

## 2014-09-18 MED ORDER — PHENYLEPHRINE 40 MCG/ML (10ML) SYRINGE FOR IV PUSH (FOR BLOOD PRESSURE SUPPORT)
PREFILLED_SYRINGE | INTRAVENOUS | Status: AC
  Filled 2014-09-18: qty 10

## 2014-09-18 MED ORDER — ARTIFICIAL TEARS OP OINT
TOPICAL_OINTMENT | OPHTHALMIC | Status: DC | PRN
  Administered 2014-09-18: 1 via OPHTHALMIC

## 2014-09-18 MED ORDER — HYDROMORPHONE HCL 1 MG/ML IJ SOLN: 0.2500 mg | INTRAMUSCULAR | Status: DC | PRN

## 2014-09-18 MED ORDER — HYDROMORPHONE HCL 1 MG/ML IJ SOLN
0.2500 mg | INTRAMUSCULAR | Status: DC | PRN
  Administered 2014-09-18 (×4): 0.5 mg via INTRAVENOUS

## 2014-09-18 MED ORDER — MAGNESIUM HYDROXIDE 400 MG/5ML PO SUSP
30.0000 mL | Freq: Every day | ORAL | Status: DC | PRN
  Administered 2014-09-18: 30 mL via ORAL
  Filled 2014-09-18: qty 30

## 2014-09-18 MED ORDER — METHOCARBAMOL 1000 MG/10ML IJ SOLN
500.0000 mg | Freq: Four times a day (QID) | INTRAVENOUS | Status: DC | PRN
  Filled 2014-09-18: qty 5

## 2014-09-18 MED ORDER — ASPIRIN EC 325 MG PO TBEC
325.0000 mg | DELAYED_RELEASE_TABLET | Freq: Every day | ORAL | Status: DC
  Administered 2014-09-18 – 2014-09-23 (×5): 325 mg via ORAL
  Filled 2014-09-18 (×7): qty 1

## 2014-09-18 MED ORDER — DOCUSATE SODIUM 100 MG PO CAPS
100.0000 mg | ORAL_CAPSULE | Freq: Two times a day (BID) | ORAL | Status: DC
  Administered 2014-09-18 – 2014-09-23 (×10): 100 mg via ORAL
  Filled 2014-09-18 (×10): qty 1

## 2014-09-18 MED ORDER — LIDOCAINE HCL (CARDIAC) 20 MG/ML IV SOLN
INTRAVENOUS | Status: AC
  Filled 2014-09-18: qty 5

## 2014-09-18 MED ORDER — OXYCODONE HCL 5 MG/5ML PO SOLN: 5.0000 mg | Freq: Once | ORAL | Status: AC | PRN

## 2014-09-18 MED ORDER — FENTANYL CITRATE 0.05 MG/ML IJ SOLN
INTRAMUSCULAR | Status: AC
  Filled 2014-09-18: qty 2

## 2014-09-18 MED ORDER — HYDROCHLOROTHIAZIDE 12.5 MG PO CAPS
12.5000 mg | ORAL_CAPSULE | Freq: Every day | ORAL | Status: DC
  Administered 2014-09-18 – 2014-09-23 (×6): 12.5 mg via ORAL
  Filled 2014-09-18 (×7): qty 1

## 2014-09-18 MED ORDER — METHOCARBAMOL 500 MG PO TABS
500.0000 mg | ORAL_TABLET | Freq: Four times a day (QID) | ORAL | Status: DC | PRN
  Administered 2014-09-18 – 2014-09-19 (×2): 500 mg via ORAL
  Filled 2014-09-18 (×8): qty 1

## 2014-09-18 MED ORDER — ONDANSETRON HCL 4 MG PO TABS
4.0000 mg | ORAL_TABLET | Freq: Four times a day (QID) | ORAL | Status: DC | PRN
Start: 2014-09-18 — End: 2014-09-23

## 2014-09-18 MED ORDER — MIDAZOLAM HCL 2 MG/2ML IJ SOLN
INTRAMUSCULAR | Status: AC
  Filled 2014-09-18: qty 2

## 2014-09-18 MED ORDER — ONDANSETRON HCL 4 MG/2ML IJ SOLN
INTRAMUSCULAR | Status: DC | PRN
  Administered 2014-09-18: 4 mg via INTRAVENOUS

## 2014-09-18 MED ORDER — OXYCODONE-ACETAMINOPHEN 5-325 MG PO TABS
1.0000 | ORAL_TABLET | ORAL | Status: DC | PRN
  Administered 2014-09-18 – 2014-09-23 (×12): 2 via ORAL
  Filled 2014-09-18 (×13): qty 2

## 2014-09-18 MED ORDER — DIPHENHYDRAMINE HCL 12.5 MG/5ML PO ELIX: 12.5000 mg | ORAL_SOLUTION | ORAL | Status: DC | PRN

## 2014-09-18 MED ORDER — PHENYLEPHRINE HCL 10 MG/ML IJ SOLN
INTRAMUSCULAR | Status: DC | PRN
  Administered 2014-09-18: 120 ug via INTRAVENOUS

## 2014-09-18 MED ORDER — METOCLOPRAMIDE HCL 5 MG/ML IJ SOLN: 5.0000 mg | Freq: Three times a day (TID) | INTRAMUSCULAR | Status: DC | PRN

## 2014-09-18 MED ORDER — OXYCODONE HCL 5 MG/5ML PO SOLN: 5.0000 mg | Freq: Once | ORAL | Status: DC | PRN

## 2014-09-18 MED ORDER — ARTIFICIAL TEARS OP OINT
TOPICAL_OINTMENT | OPHTHALMIC | Status: AC
  Filled 2014-09-18: qty 3.5

## 2014-09-18 MED ORDER — LIDOCAINE HCL (CARDIAC) 20 MG/ML IV SOLN
INTRAVENOUS | Status: DC | PRN
  Administered 2014-09-18: 60 mg via INTRAVENOUS

## 2014-09-18 MED ORDER — LACTATED RINGERS IV SOLN: INTRAVENOUS | Status: DC

## 2014-09-18 MED ORDER — HYDROMORPHONE HCL 1 MG/ML IJ SOLN
0.5000 mg | INTRAMUSCULAR | Status: DC | PRN
  Administered 2014-09-18 (×2): 1 mg via INTRAVENOUS
  Filled 2014-09-18 (×2): qty 1

## 2014-09-18 MED ORDER — METFORMIN HCL 500 MG PO TABS
500.0000 mg | ORAL_TABLET | Freq: Two times a day (BID) | ORAL | Status: DC
  Filled 2014-09-18 (×3): qty 1

## 2014-09-18 MED ORDER — ONDANSETRON HCL 4 MG/2ML IJ SOLN: 4.0000 mg | Freq: Once | INTRAMUSCULAR | Status: DC | PRN

## 2014-09-18 MED ORDER — LISINOPRIL-HYDROCHLOROTHIAZIDE 10-12.5 MG PO TABS: 1.0000 | ORAL_TABLET | Freq: Every day | ORAL | Status: DC

## 2014-09-18 MED ORDER — FENTANYL CITRATE 0.05 MG/ML IJ SOLN
100.0000 ug | Freq: Once | INTRAMUSCULAR | Status: AC
  Administered 2014-09-18: 100 ug via INTRAVENOUS
  Filled 2014-09-18: qty 2

## 2014-09-18 MED ORDER — BISACODYL 5 MG PO TBEC
5.0000 mg | DELAYED_RELEASE_TABLET | Freq: Every day | ORAL | Status: DC | PRN
  Administered 2014-09-20: 5 mg via ORAL
  Filled 2014-09-18: qty 1

## 2014-09-18 MED ORDER — SODIUM CHLORIDE 0.9 % IV SOLN
INTRAVENOUS | Status: DC
  Administered 2014-09-18: 19:00:00 via INTRAVENOUS

## 2014-09-18 MED ORDER — PROPOFOL 10 MG/ML IV BOLUS
INTRAVENOUS | Status: DC | PRN
  Administered 2014-09-18: 200 mg via INTRAVENOUS

## 2014-09-18 MED ORDER — HYDROMORPHONE HCL 1 MG/ML IJ SOLN
INTRAMUSCULAR | Status: AC
  Filled 2014-09-18: qty 2

## 2014-09-18 MED ORDER — OXYCODONE HCL 5 MG PO TABS
5.0000 mg | ORAL_TABLET | Freq: Once | ORAL | Status: AC | PRN
  Administered 2014-09-18: 5 mg via ORAL

## 2014-09-18 MED ORDER — OXYCODONE HCL 5 MG PO TABS: 5.0000 mg | ORAL_TABLET | Freq: Once | ORAL | Status: DC | PRN

## 2014-09-18 MED ORDER — MIDAZOLAM HCL 5 MG/5ML IJ SOLN
INTRAMUSCULAR | Status: DC | PRN
  Administered 2014-09-18 (×2): 2 mg via INTRAVENOUS

## 2014-09-18 MED ORDER — GLIMEPIRIDE 4 MG PO TABS
4.0000 mg | ORAL_TABLET | Freq: Every day | ORAL | Status: DC
  Administered 2014-09-19 – 2014-09-23 (×5): 4 mg via ORAL
  Filled 2014-09-18 (×8): qty 1

## 2014-09-18 MED ORDER — LISINOPRIL 10 MG PO TABS
10.0000 mg | ORAL_TABLET | Freq: Every day | ORAL | Status: DC
  Administered 2014-09-18 – 2014-09-23 (×6): 10 mg via ORAL
  Filled 2014-09-18 (×7): qty 1

## 2014-09-18 MED ORDER — CEFAZOLIN SODIUM 1-5 GM-% IV SOLN
1.0000 g | Freq: Four times a day (QID) | INTRAVENOUS | Status: AC
  Administered 2014-09-18 – 2014-09-19 (×3): 1 g via INTRAVENOUS
  Filled 2014-09-18 (×3): qty 50

## 2014-09-18 MED ORDER — OXYCODONE HCL 5 MG PO TABS
ORAL_TABLET | ORAL | Status: AC
  Filled 2014-09-18: qty 1

## 2014-09-18 MED ORDER — HYDROMORPHONE HCL 1 MG/ML IJ SOLN
0.5000 mg | INTRAMUSCULAR | Status: AC | PRN
  Administered 2014-09-18 (×4): 0.5 mg via INTRAVENOUS

## 2014-09-18 MED ORDER — FENTANYL CITRATE 0.05 MG/ML IJ SOLN
INTRAMUSCULAR | Status: DC | PRN
  Administered 2014-09-18 (×2): 100 ug via INTRAVENOUS
  Administered 2014-09-18: 50 ug via INTRAVENOUS

## 2014-09-18 MED ORDER — LACTATED RINGERS IV SOLN
INTRAVENOUS | Status: DC
  Administered 2014-09-18 (×2): via INTRAVENOUS

## 2014-09-18 MED ORDER — METFORMIN HCL 500 MG PO TABS
1000.0000 mg | ORAL_TABLET | Freq: Two times a day (BID) | ORAL | Status: DC
  Administered 2014-09-18 – 2014-09-23 (×9): 1000 mg via ORAL
  Filled 2014-09-18 (×13): qty 2

## 2014-09-18 MED ORDER — ONDANSETRON HCL 4 MG/2ML IJ SOLN
INTRAMUSCULAR | Status: AC
  Filled 2014-09-18: qty 2

## 2014-09-18 MED ORDER — MAGNESIUM CITRATE PO SOLN
1.0000 | Freq: Once | ORAL | Status: AC | PRN
  Filled 2014-09-18: qty 296

## 2014-09-18 SURGICAL SUPPLY — 35 items
BLADE SAW SGTL HD 18.5X60.5X1. (BLADE) ×2 IMPLANT
BLADE SURG 10 STRL SS (BLADE) IMPLANT
BNDG COHESIVE 4X5 TAN STRL (GAUZE/BANDAGES/DRESSINGS) ×2 IMPLANT
BNDG GAUZE ELAST 4 BULKY (GAUZE/BANDAGES/DRESSINGS) ×4 IMPLANT
COVER SURGICAL LIGHT HANDLE (MISCELLANEOUS) ×2 IMPLANT
DRAPE U-SHAPE 47X51 STRL (DRAPES) ×2 IMPLANT
DRSG ADAPTIC 3X8 NADH LF (GAUZE/BANDAGES/DRESSINGS) ×2 IMPLANT
DRSG PAD ABDOMINAL 8X10 ST (GAUZE/BANDAGES/DRESSINGS) ×4 IMPLANT
DURAPREP 26ML APPLICATOR (WOUND CARE) ×2 IMPLANT
ELECT REM PT RETURN 9FT ADLT (ELECTROSURGICAL) ×2
ELECTRODE REM PT RTRN 9FT ADLT (ELECTROSURGICAL) ×1 IMPLANT
GAUZE SPONGE 4X4 12PLY STRL (GAUZE/BANDAGES/DRESSINGS) ×2 IMPLANT
GLOVE BIOGEL PI IND STRL 8 (GLOVE) ×2 IMPLANT
GLOVE BIOGEL PI IND STRL 9 (GLOVE) ×1 IMPLANT
GLOVE BIOGEL PI INDICATOR 8 (GLOVE) ×2
GLOVE BIOGEL PI INDICATOR 9 (GLOVE) ×1
GLOVE SURG ORTHO 9.0 STRL STRW (GLOVE) ×2 IMPLANT
GOWN PREVENTION PLUS XLARGE (GOWN DISPOSABLE) ×2 IMPLANT
GOWN PREVENTION PLUS XXLARGE (GOWN DISPOSABLE) ×4 IMPLANT
GOWN STRL REUS W/ TWL XL LVL3 (GOWN DISPOSABLE) ×3 IMPLANT
GOWN STRL REUS W/TWL XL LVL3 (GOWN DISPOSABLE) ×3
HANDPIECE INTERPULSE COAX TIP (DISPOSABLE) ×1
KIT BASIN OR (CUSTOM PROCEDURE TRAY) ×2 IMPLANT
KIT ROOM TURNOVER OR (KITS) ×2 IMPLANT
NS IRRIG 1000ML POUR BTL (IV SOLUTION) ×2 IMPLANT
PACK ORTHO EXTREMITY (CUSTOM PROCEDURE TRAY) ×2 IMPLANT
PAD ARMBOARD 7.5X6 YLW CONV (MISCELLANEOUS) ×4 IMPLANT
SET HNDPC FAN SPRY TIP SCT (DISPOSABLE) ×1 IMPLANT
SPONGE GAUZE 4X4 12PLY STER LF (GAUZE/BANDAGES/DRESSINGS) ×2 IMPLANT
SPONGE LAP 18X18 X RAY DECT (DISPOSABLE) ×2 IMPLANT
SUT ETHILON 2 0 PSLX (SUTURE) ×6 IMPLANT
SUT VIC AB 2-0 CTB1 (SUTURE) IMPLANT
TOWEL OR 17X24 6PK STRL BLUE (TOWEL DISPOSABLE) ×2 IMPLANT
TOWEL OR 17X26 10 PK STRL BLUE (TOWEL DISPOSABLE) ×2 IMPLANT
WATER STERILE IRR 1000ML POUR (IV SOLUTION) ×2 IMPLANT

## 2014-09-18 NOTE — Op Note (Signed)
09/18/2014  4:19 PM  PATIENT:  Marc Taylor    PRE-OPERATIVE DIAGNOSIS:  Gangrene Right Foot  POST-OPERATIVE DIAGNOSIS:  Same  PROCEDURE:  Right Midfoot Amputation  SURGEON:  Nadara MustardUDA,Tram Wrenn V, MD  PHYSICIAN ASSISTANT:None ANESTHESIA:   General  PREOPERATIVE INDICATIONS:  Marc DayGeorge Arechiga is a  53 y.o. male with a diagnosis of Gangrene Right Foot who failed conservative measures and elected for surgical management.    The risks benefits and alternatives were discussed with the patient preoperatively including but not limited to the risks of infection, bleeding, nerve injury, cardiopulmonary complications, the need for revision surgery, among others, and the patient was willing to proceed.  OPERATIVE IMPLANTS: none  OPERATIVE FINDINGS: extensive gangrenous changes of the muscle and plantar skin  OPERATIVE PROCEDURE: patient was brought to the operating room and underwent a general anesthetic. After adequate levels of anesthesia were obtained patient's right lower extremity was prepped using DuraPrep draped into a sterile field. A fishmouth incision was made to the midfoot. Both the dorsal and plantar skin flaps were ischemic. The plantar flap was ischemic although way to the heel pad. There was some healthy muscle but the majority of the soft tissue was still ischemic at the midfoot level. Patient underwent a transmetatarsal amputation. The wound was irrigated with pulsatile lavage necrotic tissue was debrided. The wound was packed open with sterile dressing. Patient was extubated taken to the PACU in stable condition   Will plan for return to the operating room on Friday. We'll further evaluate the viability of the soft tissue flaps. Patient most likely will require a transtibial amputation if the flaps are nonviable.

## 2014-09-18 NOTE — Transfer of Care (Signed)
Immediate Anesthesia Transfer of Care Note  Patient: Marc Taylor  Procedure(s) Performed: Procedure(s): Right Midfoot Amputation (Right)  Patient Location: PACU  Anesthesia Type:General  Level of Consciousness: lethargic and responds to stimulation  Airway & Oxygen Therapy: Patient Spontanous Breathing and Patient connected to nasal cannula oxygen  Post-op Assessment: Report given to PACU RN  Post vital signs: Reviewed and stable  Complications: No apparent anesthesia complications

## 2014-09-18 NOTE — H&P (Signed)
Marc Taylor is an 53 y.o. male.   Chief Complaint: gangrene right forefoot HPI: patient is status post crush injury to the right foot. He is status post open reduction internal fixation as well as fasciotomies. Patient has dry gangrene of the forefoot at this time.  Past Medical History  Diagnosis Date  . Diabetes mellitus without complication   . Hypertension   . Type II diabetes mellitus     "just dx'd" (08/24/2013)  . History of blood transfusion 1963    "one; I had yellow jaundice" (08/24/2013)  . Compartment syndrome of right foot 09/04/2014  . Fracture of fifth toe, right, closed 09/04/2014    Past Surgical History  Procedure Laterality Date  . Fasciotomy Right 09/04/2014    Procedure: Right Foot FASCIOTOMY and wound vac placement;  Surgeon: [name], MD;  [facility];  Service: Orthopedics;  Laterality: Right;  . Open reduction internal fixation (orif) foot lisfranc fracture Right 09/04/2014    Procedure: OPEN REDUCTION INTERNAL FIXATION (ORIF) FOOT LISFRANC FRACTURE;  Surgeon: [name], MD;  Location: [facility] OR;  Service: Orthopedics;  Laterality: Right;  . I&d extremity Right 09/06/2014    Procedure: Irrigation and Debridement Right Foot, TheraSkin graft and wound vac change out;  Surgeon: Nadara MustardMarcus Taylor V, MD;  Location: [facility] OR;  Service: Orthopedics;  Laterality: Right;    Family History  Problem Relation Age of Onset  . Cancer Mother   . Liver disease Mother   . Diabetes type II Father   . Hypertension Father   . Diabetes type II Sister   . Hypertension Sister    Social History:  reports that he has never smoked. He has never used smokeless tobacco. He reports that he drinks alcohol. He reports that he does not use illicit drugs.  Allergies: No Known Allergies  No prescriptions prior to admission    No results found for this or any previous visit (from the past 48 hour(s)). No results found.  Review of Systems  All other systems reviewed and are  negative.   There were no vitals taken for this visit. Physical Exam  On examination patient has no sensation no bleeding in the forefoot. He has gangrene extends to the midfoot region. Assessment/Plan Assessment: Right forefoot dry gangrene.  Plan: We'll plan for midfoot amputation. Risks and benefits were discussed including potential for additional surgery potential for additional amputation.  Taylor,Marc V 09/18/2014, 6:24 AM

## 2014-09-18 NOTE — Anesthesia Procedure Notes (Signed)
Procedure Name: LMA Insertion Date/Time: 09/18/2014 3:50 PM Performed by: Jefm MilesENNIE, Tenzin Pavon E Pre-anesthesia Checklist: Patient identified, Emergency Drugs available, Suction available, Patient being monitored and Timeout performed Patient Re-evaluated:Patient Re-evaluated prior to inductionOxygen Delivery Method: Circle system utilized Preoxygenation: Pre-oxygenation with 100% oxygen Intubation Type: IV induction Ventilation: Mask ventilation without difficulty LMA: LMA inserted LMA Size: 5.0 Number of attempts: 1 Placement Confirmation: positive ETCO2 and breath sounds checked- equal and bilateral Tube secured with: Tape Dental Injury: Teeth and Oropharynx as per pre-operative assessment

## 2014-09-18 NOTE — Anesthesia Postprocedure Evaluation (Signed)
  Anesthesia Post-op Note  Patient: Marc Taylor  Procedure(s) Performed: Procedure(s): Right Midfoot Amputation (Right)  Patient Location: PACU  Anesthesia Type: General   Level of Consciousness: awake, alert  and oriented  Airway and Oxygen Therapy: Patient Spontanous Breathing  Post-op Pain: moderate  Post-op Assessment: Post-op Vital signs reviewed  Post-op Vital Signs: Reviewed  Last Vitals:  Filed Vitals:   09/18/14 1700  BP: 141/75  Pulse: 84  Temp:   Resp: 18    Complications: No apparent anesthesia complications

## 2014-09-18 NOTE — Anesthesia Preprocedure Evaluation (Addendum)
Anesthesia Evaluation  Patient identified by MRN, date of birth, ID band Patient awake    Reviewed: Allergy & Precautions, H&P , NPO status , Patient's Chart, lab work & pertinent test results  Airway Mallampati: II   Neck ROM: full    Dental  (+) Missing, Dental Advisory Given, Teeth Intact   Pulmonary neg pulmonary ROS,  breath sounds clear to auscultation        Cardiovascular hypertension, Pt. on medications Rhythm:Regular Rate:Normal     Neuro/Psych    GI/Hepatic   Endo/Other  diabetes, Poorly Controlled, Type 2, Insulin DependentMorbid obesity  Renal/GU      Musculoskeletal   Abdominal   Peds  Hematology   Anesthesia Other Findings   Reproductive/Obstetrics                            Anesthesia Physical Anesthesia Plan  ASA: III  Anesthesia Plan: General   Post-op Pain Management:    Induction: Intravenous  Airway Management Planned: LMA  Additional Equipment:   Intra-op Plan:   Post-operative Plan: Extubation in OR  Informed Consent: I have reviewed the patients History and Physical, chart, labs and discussed the procedure including the risks, benefits and alternatives for the proposed anesthesia with the patient or authorized representative who has indicated his/her understanding and acceptance.   Dental advisory given  Plan Discussed with: CRNA, Anesthesiologist and Surgeon  Anesthesia Plan Comments:        Anesthesia Quick Evaluation

## 2014-09-19 ENCOUNTER — Inpatient Hospital Stay (HOSPITAL_COMMUNITY): Payer: Worker's Compensation | Admitting: Certified Registered"

## 2014-09-19 ENCOUNTER — Encounter (HOSPITAL_COMMUNITY)
Admission: RE | Disposition: A | Discharge status: Home Health | Payer: Self-pay | Source: Ambulatory Visit | Attending: Orthopedic Surgery

## 2014-09-19 ENCOUNTER — Encounter (HOSPITAL_COMMUNITY): Payer: Self-pay | Admitting: General Practice

## 2014-09-19 HISTORY — PX: AMPUTATION: SHX166

## 2014-09-19 LAB — GLUCOSE, CAPILLARY
Glucose-Capillary: 113 mg/dL — ABNORMAL HIGH (ref 70–99)
Glucose-Capillary: 79 mg/dL (ref 70–99)
Glucose-Capillary: 79 mg/dL (ref 70–99)
Glucose-Capillary: 93 mg/dL (ref 70–99)
Glucose-Capillary: 104 mg/dL — ABNORMAL HIGH (ref 70–99)

## 2014-09-19 LAB — SURGICAL PCR SCREEN
MRSA, PCR: NEGATIVE
Staphylococcus aureus: NEGATIVE

## 2014-09-19 SURGERY — AMPUTATION BELOW KNEE
Anesthesia: General | Site: Leg Lower | Laterality: Right

## 2014-09-19 MED ORDER — HYDROMORPHONE 0.3 MG/ML IV SOLN
INTRAVENOUS | Status: AC
  Filled 2014-09-19: qty 25

## 2014-09-19 MED ORDER — METHOCARBAMOL 1000 MG/10ML IJ SOLN
500.0000 mg | Freq: Once | INTRAMUSCULAR | Status: AC
  Administered 2014-09-19: 500 mg via INTRAVENOUS
  Filled 2014-09-19: qty 5

## 2014-09-19 MED ORDER — HYDROMORPHONE HCL 1 MG/ML IJ SOLN
INTRAMUSCULAR | Status: AC
  Filled 2014-09-19: qty 1

## 2014-09-19 MED ORDER — DEXTROSE 5 % IV SOLN
500.0000 mg | Freq: Four times a day (QID) | INTRAVENOUS | Status: DC | PRN
  Filled 2014-09-19: qty 5

## 2014-09-19 MED ORDER — PHENYLEPHRINE 40 MCG/ML (10ML) SYRINGE FOR IV PUSH (FOR BLOOD PRESSURE SUPPORT)
PREFILLED_SYRINGE | INTRAVENOUS | Status: AC
  Filled 2014-09-19: qty 10

## 2014-09-19 MED ORDER — ACETAMINOPHEN 10 MG/ML IV SOLN
1000.0000 mg | Freq: Once | INTRAVENOUS | Status: AC
  Administered 2014-09-19: 1000 mg via INTRAVENOUS
  Filled 2014-09-19: qty 100

## 2014-09-19 MED ORDER — OXYCODONE-ACETAMINOPHEN 5-325 MG PO TABS: 1.0000 | ORAL_TABLET | ORAL | Status: DC | PRN

## 2014-09-19 MED ORDER — ONDANSETRON HCL 4 MG/2ML IJ SOLN
INTRAMUSCULAR | Status: AC
  Filled 2014-09-19: qty 2

## 2014-09-19 MED ORDER — FENTANYL CITRATE 0.05 MG/ML IJ SOLN
INTRAMUSCULAR | Status: AC
  Filled 2014-09-19: qty 5

## 2014-09-19 MED ORDER — MIDAZOLAM HCL 2 MG/2ML IJ SOLN
INTRAMUSCULAR | Status: AC
  Filled 2014-09-19: qty 2

## 2014-09-19 MED ORDER — FLEET ENEMA 7-19 GM/118ML RE ENEM
1.0000 | ENEMA | Freq: Every day | RECTAL | Status: DC | PRN
  Filled 2014-09-19 (×2): qty 1

## 2014-09-19 MED ORDER — PROPOFOL 10 MG/ML IV BOLUS
INTRAVENOUS | Status: DC | PRN
  Administered 2014-09-19: 200 mg via INTRAVENOUS

## 2014-09-19 MED ORDER — FENTANYL CITRATE 0.05 MG/ML IJ SOLN
INTRAMUSCULAR | Status: DC | PRN
  Administered 2014-09-19: 50 ug via INTRAVENOUS
  Administered 2014-09-19 (×2): 150 ug via INTRAVENOUS
  Administered 2014-09-19: 100 ug via INTRAVENOUS
  Administered 2014-09-19 (×2): 25 ug via INTRAVENOUS

## 2014-09-19 MED ORDER — CHLORHEXIDINE GLUCONATE 4 % EX LIQD
60.0000 mL | Freq: Once | CUTANEOUS | Status: AC
  Administered 2014-09-19: 4 via TOPICAL
  Filled 2014-09-19: qty 60

## 2014-09-19 MED ORDER — OXYCODONE HCL 5 MG PO TABS
5.0000 mg | ORAL_TABLET | Freq: Once | ORAL | Status: AC | PRN
Start: 2014-09-19 — End: 2014-09-19
  Administered 2014-09-19: 5 mg via ORAL

## 2014-09-19 MED ORDER — GABAPENTIN 300 MG PO CAPS
300.0000 mg | ORAL_CAPSULE | Freq: Three times a day (TID) | ORAL | Status: DC
  Administered 2014-09-19 – 2014-09-23 (×12): 300 mg via ORAL
  Filled 2014-09-19 (×16): qty 1

## 2014-09-19 MED ORDER — METOCLOPRAMIDE HCL 5 MG PO TABS: 5.0000 mg | ORAL_TABLET | Freq: Three times a day (TID) | ORAL | Status: DC | PRN

## 2014-09-19 MED ORDER — ONDANSETRON HCL 4 MG PO TABS: 4.0000 mg | ORAL_TABLET | Freq: Four times a day (QID) | ORAL | Status: DC | PRN

## 2014-09-19 MED ORDER — ONDANSETRON HCL 4 MG/2ML IJ SOLN: 4.0000 mg | Freq: Four times a day (QID) | INTRAMUSCULAR | Status: DC | PRN

## 2014-09-19 MED ORDER — SODIUM CHLORIDE 0.9 % IJ SOLN: 9.0000 mL | INTRAMUSCULAR | Status: DC | PRN

## 2014-09-19 MED ORDER — SODIUM CHLORIDE 0.9 % IV SOLN
INTRAVENOUS | Status: DC
  Administered 2014-09-19 – 2014-09-22 (×2): via INTRAVENOUS

## 2014-09-19 MED ORDER — OXYCODONE HCL 5 MG/5ML PO SOLN: 5.0000 mg | Freq: Once | ORAL | Status: AC | PRN

## 2014-09-19 MED ORDER — LACTATED RINGERS IV SOLN
INTRAVENOUS | Status: DC | PRN
  Administered 2014-09-19: 18:00:00 via INTRAVENOUS

## 2014-09-19 MED ORDER — GABAPENTIN 300 MG PO CAPS: 300.0000 mg | ORAL_CAPSULE | Freq: Once | ORAL | Status: DC

## 2014-09-19 MED ORDER — NALOXONE HCL 0.4 MG/ML IJ SOLN: 0.4000 mg | INTRAMUSCULAR | Status: DC | PRN

## 2014-09-19 MED ORDER — DIPHENHYDRAMINE HCL 50 MG/ML IJ SOLN: 12.5000 mg | Freq: Four times a day (QID) | INTRAMUSCULAR | Status: DC | PRN

## 2014-09-19 MED ORDER — DOCUSATE SODIUM 100 MG PO CAPS: 100.0000 mg | ORAL_CAPSULE | Freq: Two times a day (BID) | ORAL | Status: DC

## 2014-09-19 MED ORDER — 0.9 % SODIUM CHLORIDE (POUR BTL) OPTIME
TOPICAL | Status: DC | PRN
  Administered 2014-09-19: 1000 mL

## 2014-09-19 MED ORDER — ACETAMINOPHEN 10 MG/ML IV SOLN
INTRAVENOUS | Status: AC
  Administered 2014-09-19: 1000 mg via INTRAVENOUS
  Filled 2014-09-19: qty 100

## 2014-09-19 MED ORDER — CEFAZOLIN SODIUM 1-5 GM-% IV SOLN
1.0000 g | Freq: Four times a day (QID) | INTRAVENOUS | Status: AC
  Administered 2014-09-19 – 2014-09-20 (×3): 1 g via INTRAVENOUS
  Filled 2014-09-19 (×4): qty 50

## 2014-09-19 MED ORDER — HYDROMORPHONE HCL 1 MG/ML IJ SOLN
0.2500 mg | INTRAMUSCULAR | Status: DC | PRN
  Administered 2014-09-19 (×2): 0.5 mg via INTRAVENOUS

## 2014-09-19 MED ORDER — HYDROMORPHONE HCL 1 MG/ML IJ SOLN: 0.5000 mg | INTRAMUSCULAR | Status: DC | PRN

## 2014-09-19 MED ORDER — METOCLOPRAMIDE HCL 5 MG/ML IJ SOLN: 5.0000 mg | Freq: Three times a day (TID) | INTRAMUSCULAR | Status: DC | PRN

## 2014-09-19 MED ORDER — ACETAMINOPHEN 10 MG/ML IV SOLN
INTRAVENOUS | Status: AC
  Filled 2014-09-19: qty 100

## 2014-09-19 MED ORDER — LACTATED RINGERS IV SOLN
INTRAVENOUS | Status: DC
  Administered 2014-09-19: 17:00:00 via INTRAVENOUS

## 2014-09-19 MED ORDER — MIDAZOLAM HCL 5 MG/5ML IJ SOLN
INTRAMUSCULAR | Status: DC | PRN
  Administered 2014-09-19: 2 mg via INTRAVENOUS

## 2014-09-19 MED ORDER — HYDROMORPHONE 0.3 MG/ML IV SOLN
INTRAVENOUS | Status: DC
  Administered 2014-09-19: 20:00:00 via INTRAVENOUS
  Administered 2014-09-20: 3.5 mg via INTRAVENOUS
  Administered 2014-09-20: 12:00:00 via INTRAVENOUS
  Administered 2014-09-20: 2.7 mg via INTRAVENOUS
  Administered 2014-09-20: 4.5 mg via INTRAVENOUS
  Administered 2014-09-20: 5.1 mg via INTRAVENOUS
  Administered 2014-09-20: 2.1 mg via INTRAVENOUS
  Administered 2014-09-20: 3 mg via INTRAVENOUS
  Administered 2014-09-20: 04:00:00 via INTRAVENOUS
  Administered 2014-09-21: 5.4 mg via INTRAVENOUS
  Administered 2014-09-21: 4.5 mg via INTRAVENOUS
  Administered 2014-09-21: 3.3 mg via INTRAVENOUS
  Administered 2014-09-21: 3.83 mg via INTRAVENOUS
  Administered 2014-09-21: 2.4 mg via INTRAVENOUS
  Administered 2014-09-21 – 2014-09-22 (×2): 1.2 mg via INTRAVENOUS
  Administered 2014-09-22: 1.8 mg via INTRAVENOUS
  Administered 2014-09-22: 1.2 mg via INTRAVENOUS
  Administered 2014-09-22: 3.3 mg via INTRAVENOUS
  Filled 2014-09-19 (×6): qty 25

## 2014-09-19 MED ORDER — ONDANSETRON HCL 4 MG/2ML IJ SOLN
INTRAMUSCULAR | Status: DC | PRN
  Administered 2014-09-19: 4 mg via INTRAVENOUS

## 2014-09-19 MED ORDER — HYDROMORPHONE HCL 1 MG/ML IJ SOLN
INTRAMUSCULAR | Status: AC
  Filled 2014-09-19: qty 2

## 2014-09-19 MED ORDER — PHENYLEPHRINE HCL 10 MG/ML IJ SOLN
INTRAMUSCULAR | Status: DC | PRN
  Administered 2014-09-19: 80 ug via INTRAVENOUS

## 2014-09-19 MED ORDER — OXYCODONE HCL 5 MG PO TABS
ORAL_TABLET | ORAL | Status: AC
  Filled 2014-09-19: qty 1

## 2014-09-19 MED ORDER — LIDOCAINE HCL (CARDIAC) 20 MG/ML IV SOLN
INTRAVENOUS | Status: DC | PRN
  Administered 2014-09-19: 100 mg via INTRAVENOUS

## 2014-09-19 MED ORDER — CEFAZOLIN SODIUM-DEXTROSE 2-3 GM-% IV SOLR
2.0000 g | INTRAVENOUS | Status: AC
  Administered 2014-09-19: 2 g via INTRAVENOUS
  Filled 2014-09-19: qty 50

## 2014-09-19 MED ORDER — HYDROMORPHONE HCL 1 MG/ML IJ SOLN
0.2500 mg | INTRAMUSCULAR | Status: DC | PRN
  Administered 2014-09-19 (×4): 0.5 mg via INTRAVENOUS

## 2014-09-19 MED ORDER — METHOCARBAMOL 500 MG PO TABS
500.0000 mg | ORAL_TABLET | Freq: Four times a day (QID) | ORAL | Status: DC | PRN
  Administered 2014-09-19 – 2014-09-23 (×11): 500 mg via ORAL
  Filled 2014-09-19 (×6): qty 1

## 2014-09-19 MED ORDER — PROPOFOL 10 MG/ML IV BOLUS
INTRAVENOUS | Status: AC
  Filled 2014-09-19: qty 20

## 2014-09-19 MED ORDER — ASPIRIN EC 325 MG PO TBEC: 325.0000 mg | DELAYED_RELEASE_TABLET | Freq: Every day | ORAL | Status: DC

## 2014-09-19 MED ORDER — GABAPENTIN 300 MG PO CAPS
300.0000 mg | ORAL_CAPSULE | Freq: Once | ORAL | Status: AC
Start: 2014-09-19 — End: 2014-09-19
  Administered 2014-09-19: 300 mg via ORAL
  Filled 2014-09-19: qty 1

## 2014-09-19 MED ORDER — DIPHENHYDRAMINE HCL 12.5 MG/5ML PO ELIX: 12.5000 mg | ORAL_SOLUTION | Freq: Four times a day (QID) | ORAL | Status: DC | PRN

## 2014-09-19 SURGICAL SUPPLY — 41 items
BLADE SAW RECIP 87.9 MT (BLADE) ×2 IMPLANT
BLADE SURG 21 STRL SS (BLADE) ×2 IMPLANT
BNDG COHESIVE 6X5 TAN STRL LF (GAUZE/BANDAGES/DRESSINGS) ×2 IMPLANT
BNDG GAUZE ELAST 4 BULKY (GAUZE/BANDAGES/DRESSINGS) ×4 IMPLANT
COVER SURGICAL LIGHT HANDLE (MISCELLANEOUS) ×2 IMPLANT
CUFF TOURNIQUET SINGLE 34IN LL (TOURNIQUET CUFF) ×2 IMPLANT
CUFF TOURNIQUET SINGLE 44IN (TOURNIQUET CUFF) IMPLANT
DRAPE EXTREMITY T 121X128X90 (DRAPE) ×2 IMPLANT
DRAPE PROXIMA HALF (DRAPES) ×4 IMPLANT
DRAPE U-SHAPE 47X51 STRL (DRAPES) ×2 IMPLANT
DRSG ADAPTIC 3X8 NADH LF (GAUZE/BANDAGES/DRESSINGS) ×2 IMPLANT
DRSG PAD ABDOMINAL 8X10 ST (GAUZE/BANDAGES/DRESSINGS) ×6 IMPLANT
DURAPREP 26ML APPLICATOR (WOUND CARE) ×2 IMPLANT
ELECT REM PT RETURN 9FT ADLT (ELECTROSURGICAL) ×2
ELECTRODE REM PT RTRN 9FT ADLT (ELECTROSURGICAL) ×1 IMPLANT
GAUZE SPONGE 4X4 12PLY STRL (GAUZE/BANDAGES/DRESSINGS) ×2 IMPLANT
GLOVE BIOGEL PI IND STRL 6.5 (GLOVE) ×1 IMPLANT
GLOVE BIOGEL PI IND STRL 9 (GLOVE) ×1 IMPLANT
GLOVE BIOGEL PI INDICATOR 6.5 (GLOVE) ×1
GLOVE BIOGEL PI INDICATOR 9 (GLOVE) ×1
GLOVE ECLIPSE 6.5 STRL STRAW (GLOVE) ×2 IMPLANT
GLOVE SURG ORTHO 9.0 STRL STRW (GLOVE) ×2 IMPLANT
GOWN STRL REUS W/ TWL XL LVL3 (GOWN DISPOSABLE) ×2 IMPLANT
GOWN STRL REUS W/TWL XL LVL3 (GOWN DISPOSABLE) ×2
KIT BASIN OR (CUSTOM PROCEDURE TRAY) ×2 IMPLANT
KIT ROOM TURNOVER OR (KITS) ×2 IMPLANT
MANIFOLD NEPTUNE II (INSTRUMENTS) ×2 IMPLANT
NS IRRIG 1000ML POUR BTL (IV SOLUTION) ×2 IMPLANT
PACK GENERAL/GYN (CUSTOM PROCEDURE TRAY) ×2 IMPLANT
PAD ARMBOARD 7.5X6 YLW CONV (MISCELLANEOUS) ×4 IMPLANT
SPONGE GAUZE 4X4 12PLY STER LF (GAUZE/BANDAGES/DRESSINGS) ×2 IMPLANT
SPONGE LAP 18X18 X RAY DECT (DISPOSABLE) IMPLANT
STAPLER VISISTAT 35W (STAPLE) IMPLANT
STOCKINETTE IMPERVIOUS LG (DRAPES) ×2 IMPLANT
SUT ETHILON 2 0 PSLX (SUTURE) ×4 IMPLANT
SUT SILK 2 0 (SUTURE) ×1
SUT SILK 2-0 18XBRD TIE 12 (SUTURE) ×1 IMPLANT
SUT VIC AB 1 CTX 27 (SUTURE) ×2 IMPLANT
TOWEL OR 17X24 6PK STRL BLUE (TOWEL DISPOSABLE) ×2 IMPLANT
TOWEL OR 17X26 10 PK STRL BLUE (TOWEL DISPOSABLE) ×2 IMPLANT
WATER STERILE IRR 1000ML POUR (IV SOLUTION) ×2 IMPLANT

## 2014-09-19 NOTE — Anesthesia Preprocedure Evaluation (Addendum)
Anesthesia Evaluation  Patient identified by MRN, date of birth, ID band Patient awake    Reviewed: Allergy & Precautions, H&P , NPO status , Patient's Chart, lab work & pertinent test results  Airway Mallampati: II  TM Distance: >3 FB Neck ROM: Full    Dental   Pulmonary  breath sounds clear to auscultation        Cardiovascular hypertension, Rhythm:Regular Rate:Normal     Neuro/Psych    GI/Hepatic negative GI ROS, Neg liver ROS,   Endo/Other  diabetes  Renal/GU negative Renal ROS     Musculoskeletal   Abdominal   Peds  Hematology   Anesthesia Other Findings   Reproductive/Obstetrics                            Anesthesia Physical Anesthesia Plan  ASA: III  Anesthesia Plan: General   Post-op Pain Management:    Induction: Intravenous  Airway Management Planned: LMA  Additional Equipment:   Intra-op Plan:   Post-operative Plan: Extubation in OR  Informed Consent: I have reviewed the patients History and Physical, chart, labs and discussed the procedure including the risks, benefits and alternatives for the proposed anesthesia with the patient or authorized representative who has indicated his/her understanding and acceptance.   Dental advisory given  Plan Discussed with: CRNA and Anesthesiologist  Anesthesia Plan Comments:         Anesthesia Quick Evaluation

## 2014-09-19 NOTE — Progress Notes (Signed)
Pt pain 10+/10, he keeps saying stump dsg feels "too tight" at medial lower aspect. [name] fully updated by phone, new orders for po Neurontin and full dose Dilaudid PCA. Dr Ivin Bootyrews also fully updated-orders to give up to 2mg  IV additional Dilaudid and po OXY IR 5mg . Will cont to monitor closely.

## 2014-09-19 NOTE — Progress Notes (Signed)
Patient ID: Marc Taylor, male   DOB: 04-14-1961, 53 y.o.   MRN: [id number] The plantar skin and muscle on the right foot is nonviable. Discussed with the patient recommendations to proceed with a transtibial amputation. Patient agrees we'll plan to add on surgery today.

## 2014-09-19 NOTE — Transfer of Care (Signed)
Immediate Anesthesia Transfer of Care Note  Patient: Marc Taylor  Procedure(s) Performed: Procedure(s): AMPUTATION BELOW KNEE RIGHT (Right)  Patient Location: PACU  Anesthesia Type:General  Level of Consciousness: awake, alert  and oriented  Airway & Oxygen Therapy: Patient Spontanous Breathing and Patient connected to nasal cannula oxygen  Post-op Assessment: Report given to PACU RN, Post -op Vital signs reviewed and stable and Patient moving all extremities X 4  Post vital signs: Reviewed and stable  Complications: No apparent anesthesia complications

## 2014-09-19 NOTE — Plan of Care (Signed)
Problem: Phase II Progression Outcomes Goal: Progressing with IS, TCDB Outcome: Completed/Met Date Met:  09/19/14

## 2014-09-19 NOTE — Op Note (Signed)
   Date of Surgery: 09/19/2014  INDICATIONS: Mr. Marc Taylor is a 53 y.o.-year-old male who is status post foot salvage surgery for a crush injury to the right foot. Patient has failed multiple surgical interventions for salvage of the foot. Patient has gangrenous changes of the entire plantar aspect of his foot at this time and presents for transtibial amputation.` ;  The patient did consent to the procedure after discussion of the risks and benefits.  PREOPERATIVE DIAGNOSIS: gangrene right foot status post crush injury  POSTOPERATIVE DIAGNOSIS: Same.  PROCEDURE: Transtibial amputation  SURGEON: Lajoyce Cornersuda, M.D.  ANESTHESIA:  general  IV FLUIDS AND URINE: See anesthesia.  ESTIMATED BLOOD LOSS: min mL.  COMPLICATIONS: None.  DESCRIPTION OF PROCEDURE: The patient was brought to the operating room and placed supine on the operating table.  The patient had been signed prior to the procedure and this was documented. The patient had the anesthesia placed by the anesthesiologist.  A time-out was performed to confirm that this was the correct patient, site, side and location. The patient had an SCD on the opposite lower extremity. The patient did receive antibiotics prior to the incision and was re-dosed during the procedure as needed at indicated intervals.  The patient had the operative extremity prepped and draped in the standard surgical fashion.    A transverse incision was made 11 cm distal to the tibial tubercle. This curved proximally and a large posterior flap was created. The tibia was transected 1 cm proximal to the skin incision. The fibula was transected just proximal to the tibial incision. The tibia was beveled anteriorly. A large posterior flap was created. The sciatic nerve was pulled cut and allowed to retract. The vascular bundles were suture ligated with 2-0 silk. The deep and superficial fascial layers were closed using #1 PDS. The skin was closed using staples and subcutaneous 0 nylon. The  wound was covered with Adaptic orthopedic sponges AB dressing Kerlix and Coban. Patient was extubated taken to the PACU in stable condition.  Marc BakerMarcus Lashanta Elbe, MD [facility] 6:29 PM

## 2014-09-19 NOTE — Anesthesia Procedure Notes (Signed)
Procedure Name: LMA Insertion Date/Time: 09/19/2014 5:40 PM Performed by: Orvilla FusATO, Evelene Roussin A Pre-anesthesia Checklist: Patient identified, Timeout performed, Emergency Drugs available, Suction available and Patient being monitored Patient Re-evaluated:Patient Re-evaluated prior to inductionOxygen Delivery Method: Circle system utilized Preoxygenation: Pre-oxygenation with 100% oxygen Intubation Type: IV induction Ventilation: Mask ventilation without difficulty LMA: LMA inserted LMA Size: 5.0 Number of attempts: 1 Placement Confirmation: positive ETCO2 and breath sounds checked- equal and bilateral Tube secured with: Tape Dental Injury: Teeth and Oropharynx as per pre-operative assessment

## 2014-09-19 NOTE — Progress Notes (Signed)
PT Cancellation Note  Patient Details Name: Marc Taylor MRN: [id number] DOB: [birth year]   Cancelled Treatment:    Reason Eval/Treat Not Completed: Patient declined, no reason specified Pt refused to participate in PT evaluation this Am, reporting, "I don't have a pain medication time coming up any time soon so I am not getting up and making this be a 10/10." Pt to go to OR this PM for left transtibial amputation. Will follow up on 11/6 or when pt willing to participate.  [name], [name] 09/19/2014, 11:48 AM [name], PT, DPT [phone]

## 2014-09-19 NOTE — Anesthesia Postprocedure Evaluation (Signed)
  Anesthesia Post-op Note  Patient: Marc Taylor  Procedure(s) Performed: Procedure(s): AMPUTATION BELOW KNEE RIGHT (Right)  Patient Location: PACU  Anesthesia Type: General   Level of Consciousness: awake, alert  and oriented  Airway and Oxygen Therapy: Patient Spontanous Breathing  Post-op Pain: mild  Post-op Assessment: Post-op Vital signs reviewed  Post-op Vital Signs: Reviewed  Last Vitals:  Filed Vitals:   09/19/14 2000  BP:   Pulse: 73  Temp: 36.8 C  Resp: 14    Complications: No apparent anesthesia complications

## 2014-09-19 NOTE — Plan of Care (Signed)
Problem: Phase II Progression Outcomes Goal: Vital signs stable Outcome: Completed/Met Date Met:  09/19/14     

## 2014-09-19 NOTE — Plan of Care (Signed)
Problem: Phase I Progression Outcomes Goal: Voiding-avoid urinary catheter unless indicated Outcome: Completed/Met Date Met:  09/19/14     

## 2014-09-19 NOTE — Care Management (Signed)
Workers Comp Retail buyerCase Worker is Scientific laboratory technicianJoyce Kivett cell [phone]

## 2014-09-20 ENCOUNTER — Encounter (HOSPITAL_COMMUNITY): Payer: Self-pay | Admitting: Orthopedic Surgery

## 2014-09-20 DIAGNOSIS — Z89511 Acquired absence of right leg below knee: Secondary | ICD-10-CM

## 2014-09-20 DIAGNOSIS — A480 Gas gangrene: Principal | ICD-10-CM

## 2014-09-20 LAB — GLUCOSE, CAPILLARY
Glucose-Capillary: 150 mg/dL — ABNORMAL HIGH (ref 70–99)
Glucose-Capillary: 122 mg/dL — ABNORMAL HIGH (ref 70–99)
Glucose-Capillary: 97 mg/dL (ref 70–99)
Glucose-Capillary: 131 mg/dL — ABNORMAL HIGH (ref 70–99)

## 2014-09-20 NOTE — Care Management Note (Signed)
  Page 2 of 2   09/23/2014     4:07:56 PM CARE MANAGEMENT NOTE 09/23/2014  Patient:  Marc Taylor,Marc Taylor   Account Number:  [id number]  Date Initiated:  09/20/2014  Documentation initiated by:  Ronny FlurryWILE,Ingeborg Fite  Subjective/Objective Assessment:     Action/Plan:   Anticipated DC Date:  09/23/2014   Anticipated DC Plan:  HOME W HOME HEALTH SERVICES  In-house referral  Clinical Social Worker         Choice offered to / List presented to:  C-1 Patient        HH arranged  HH-2 PT      Status of service:   Medicare Important Message given?   (If response is "NO", the following Medicare IM given date fields will be blank) Date Medicare IM given:   Medicare IM given by:   Date Additional Medicare IM given:   Additional Medicare IM given by:    Discharge Disposition:    Per UR Regulation:    If discussed at Long Length of Stay Meetings, dates discussed:    Comments:   09-23-14 Patient has decided to discharge to home with home health instead of SNF . SW aware . Gerre CouchJoyce Kivett Rehailitation Consultant also aware . [facility] and [facility] Reh[facility] Consult[facility]. Gerre CouchJoyce Kivett Rehailitation Consultant concerned that patient's bedroom is on the second floor at home. Discussed this with patient and family at bedside. Patient stated he has planned and prepared everything and it will not be a problem.   Patient has crutches , walker and 2 wheelchairs at home. His bathroom is close to bedroom and will not need 3 in 1 .  Gerre CouchJoyce Kivett Rehailitation Consultant will set up Home health PT and call patient tomorrow with agency name.  Patient and family aware . Carollee HerterShannon at Dr Audrie Liauda's office aware.  Ronny FlurryHeather Athene Schuhmacher RN BSN    09-20-14 PT recommendation is CIR . CIR MD consult ordered . SW aware . Spoke to PlymouthJanine with CIR and gave Worker's Comp information to her.  [facility] Rehailitation Consultant  [phone] fax [phone]  [phone]  cell [phone]  NCM also  updated Gerre CouchJoyce Kivett  Ronny FlurryHeather Shlonda Dolloff RN BSN [phone]

## 2014-09-20 NOTE — Clinical Social Work Psychosocial (Signed)
Clinical Social Work Department BRIEF PSYCHOSOCIAL ASSESSMENT 09/20/2014  Patient:  Susa DayMANLEY,Benett     Account Number:  [id number]     Admit date:  09/18/2014  Clinical Social Worker:  Merlyn LotHOLOMAN,Heidie Krall, CLINICAL SOCIAL WORKER  Date/Time:  09/20/2014 04:00 PM  Referred by:  Physician  Date Referred:  09/18/2014 Referred for  SNF Placement   Other Referral:   Interview type:  Patient Other interview type:    PSYCHOSOCIAL DATA Living Status:  FAMILY Admitted from facility:   Level of care:   Primary support name:  Durenda Guthrieharlotte Walton Primary support relationship to patient:  SIBLING Degree of support available:   high level of support, sister and daughter at bedside    CURRENT CONCERNS Current Concerns  Post-Acute Placement   Other Concerns:    SOCIAL WORK ASSESSMENT / PLAN CSW spoke with patient concerning SNF placement.  Patient is hopeful that he is able to be admitted to [facility] but is willing to go to SNF if [facility] is unable to admit him. [facility]   Assessment/plan status:  Psychosocial Support/Ongoing Assessment of Needs Other assessment/ plan:   FL2  PASAR   Information/referral to community resources:   [place]    PATIENT'S/FAMILY'S RESPONSE TO PLAN OF CARE: Patient and family are agreeable to SNF but are hopeful patient can go to CIR.       Merlyn LotJenna Holoman, LCSWA Clinical Social Worker [phone]

## 2014-09-20 NOTE — Plan of Care (Signed)
Problem: Phase II Progression Outcomes Goal: Pain controlled Outcome: Completed/Met Date Met:  09/20/14     

## 2014-09-20 NOTE — Consult Note (Signed)
Physical Medicine and Rehabilitation Consult Reason for Consult:Right transtibial amputation after crush injury Referring Physician: Dr. Lajoyce Cornersuda   HPI: Marc Taylor is a 53 y.o.right handed male with history of hypertension as well as diabetes mellitus with peripheral neuropathy. Patient with history of crush injury to the right foot. He is status post open reduction internal fixation as well as fasciotomies in early October 2015. Independent with crutches and walker prior to admission living with his girlfriend. Presented 09/18/2014 with dry gangrene of the forefoot and no change with conservative care. Underwent right midfoot amputation 09/18/2014 per Dr. Lajoyce Cornersuda. Hospital course pain management.Nonweightbearing right lower extremity. Physical and occupational therapy evaluations pending. M.D. Has requested physical medicine rehabilitation consult.   Review of Systems  Gastrointestinal: Positive for constipation.  Musculoskeletal: Positive for myalgias and joint pain.  All other systems reviewed and are negative.  Past Medical History  Diagnosis Date  . Diabetes mellitus without complication   . Hypertension   . Type II diabetes mellitus     "just dx'd" (08/24/2013)  . History of blood transfusion 1963    "one; I had yellow jaundice" (08/24/2013)  . Compartment syndrome of right foot 09/04/2014  . Fracture of fifth toe, right, closed 09/04/2014   Past Surgical History  Procedure Laterality Date  . Fasciotomy Right 09/04/2014    Procedure: Right Foot FASCIOTOMY and wound vac placement;  Surgeon: [name], MD;  [facility];  Service: Orthopedics;  Laterality: Right;  . Open reduction internal fixation (orif) foot lisfranc fracture Right 09/04/2014    Procedure: OPEN REDUCTION INTERNAL FIXATION (ORIF) FOOT LISFRANC FRACTURE;  Surgeon: [name], MD;  Location: [facility] OR;  Service: Orthopedics;  Laterality: Right;  . I&d extremity Right 09/06/2014    Procedure:  Irrigation and Debridement Right Foot, TheraSkin graft and wound vac change out;  Surgeon: [name], MD;  Location: [facility] OR;  Service: Orthopedics;  Laterality: Right;  . Amputation Right 09/18/2014    transmetatarsal         [name]  . Amputation Right 09/18/2014    Procedure: Right Midfoot Amputation;  Surgeon: [name], MD;  [facility];  Service: Orthopedics;  Laterality: Right;   Family History  Problem Relation Age of Onset  . Cancer Mother   . Liver disease Mother   . Diabetes type II Father   . Hypertension Father   . Diabetes type II Sister   . Hypertension Sister    Social History:  reports that he has never smoked. He has never used smokeless tobacco. He reports that he drinks alcohol. He reports that he does not use illicit drugs. Allergies: No Known Allergies Medications Prior to Admission  Medication Sig Dispense Refill  . aspirin EC 325 MG tablet Take 1 tablet (325 mg total) by mouth daily. 30 tablet 0  . CINNAMON PO Take 2 tablets by mouth daily.    Marland Kitchen. doxycycline (VIBRAMYCIN) 50 MG capsule Take 2 capsules (100 mg total) by mouth 2 (two) times daily. 60 capsule 0  . fish oil-omega-3 fatty acids 1000 MG capsule Take 2 g by mouth daily.     Marland Kitchen. GARLIC PO Take 2 tablets by mouth daily.    Marland Kitchen. glimepiride (AMARYL) 4 MG tablet Take 4 mg by mouth daily with breakfast.    . lisinopril-hydrochlorothiazide (PRINZIDE,ZESTORETIC) 10-12.5 MG per tablet Take 1 tablet by mouth daily.    . metFORMIN (GLUCOPHAGE) 1000 MG tablet Take 1,000 mg by mouth 2 (  two) times daily with a meal.    . oxyCODONE-acetaminophen (PERCOCET/ROXICET) 5-325 MG per tablet Take 1-2 tablets by mouth every 6 (six) hours as needed for moderate pain or severe pain. 20 tablet 0    Home: Home Living Family/patient expects to be discharged to:: Unsure Living Arrangements: Non-relatives/Friends  Functional History:   Functional Status:  Mobility:          ADL:     Cognition: Cognition Orientation Level: Oriented X4    Blood pressure 124/68, pulse 78, temperature 97.5 F (36.4 C), temperature source Oral, resp. rate 18, height 5\' 9"  (1.753 m), weight 117.935 kg (260 lb), SpO2 100 %. Physical Exam  Constitutional: He is oriented to person, place, and time. He appears well-developed.  HENT:  Head: Normocephalic.  Eyes: EOM are normal.  Neck: Normal range of motion. Neck supple. No thyromegaly present.  Cardiovascular: Normal rate and regular rhythm.   Respiratory: Effort normal and breath sounds normal. No respiratory distress.  GI: Soft. Bowel sounds are normal. He exhibits no distension.  Neurological: He is alert and oriented to person, place, and time.  Skin:  BKA site clean and dry with dressing in place appropriately tender    Results for orders placed or performed during the hospital encounter of 09/18/14 (from the past 24 hour(s))  Glucose, capillary     Status: Abnormal   Collection Time: 09/19/14  8:15 AM  Result Value Ref Range   Glucose-Capillary 113 (H) 70 - 99 mg/dL  Glucose, capillary     Status: None   Collection Time: 09/19/14 11:43 AM  Result Value Ref Range   Glucose-Capillary 79 70 - 99 mg/dL  Surgical pcr screen     Status: None   Collection Time: 09/19/14 11:54 AM  Result Value Ref Range   MRSA, PCR NEGATIVE NEGATIVE   Staphylococcus aureus NEGATIVE NEGATIVE  Glucose, capillary     Status: None   Collection Time: 09/19/14  4:10 PM  Result Value Ref Range   Glucose-Capillary 79 70 - 99 mg/dL  Glucose, capillary     Status: None   Collection Time: 09/19/14  6:49 PM  Result Value Ref Range   Glucose-Capillary 93 70 - 99 mg/dL  Glucose, capillary     Status: Abnormal   Collection Time: 09/19/14 10:18 PM  Result Value Ref Range   Glucose-Capillary 104 (H) 70 - 99 mg/dL   Comment 1 Notify RN    Comment 2 Documented in Chart    No results found.  Assessment/Plan: Diagnosis: right BKA 1. Does the need for  close, 24 hr/day medical supervision in concert with the patient's rehab needs make it unreasonable for this patient to be served in a less intensive setting? Yes 2. Co-Morbidities requiring supervision/potential complications: dm2, htn 3. Due to bladder management, bowel management, safety, skin/wound care, disease management, medication administration, pain management and patient education, does the patient require 24 hr/day rehab nursing? Yes 4. Does the patient require coordinated care of a physician, rehab nurse, PT (1-2 hrs/day, 5 days/week) and OT (1-2 hrs/day, 5 days/week) to address physical and functional deficits in the context of the above medical diagnosis(es)? Yes Addressing deficits in the following areas: balance, endurance, locomotion, strength, transferring, bowel/bladder control, bathing, dressing, feeding, grooming and toileting 5. Can the patient actively participate in an intensive therapy program of at least 3 hrs of therapy per day at least 5 days per week? Yes 6. The potential for patient to make measurable gains while on inpatient rehab is  excellent 7. Anticipated functional outcomes upon discharge from inpatient rehab are modified independent  with PT, modified independent with OT, n/a with SLP. 8. Estimated rehab length of stay to reach the above functional goals is: 7 days 9. Does the patient have adequate social supports to accommodate these discharge functional goals? Yes 10. Anticipated D/C setting: Home 11. Anticipated post D/C treatments: HH therapy 12. Overall Rehab/Functional Prognosis: excellent  RECOMMENDATIONS: This patient's condition is appropriate for continued rehabilitative care in the following setting: CIR Patient has agreed to participate in recommended program. Yes Note that insurance prior authorization may be required for reimbursement for recommended care.  Comment: Rehab Admissions Coordinator to follow up.  Thanks,  Ranelle Oyster, MD,  [place]     09/20/2014

## 2014-09-20 NOTE — Evaluation (Signed)
Physical Therapy Evaluation Patient Details Name: Marc Taylor MRN: [id number] DOB: 07/09/1961 Today's Date: 09/20/2014   History of Present Illness  Patient is a 53 yo male admitted 09/18/14 with gangrene Rt foot.  Patient has had mult surgeries following a crush injury to Rt foot.  Now s/p Rt BKA on 09/19/14.  PMH:  HTN, DM  Clinical Impression  Patient presents with problems listed below.  Will benefit from acute PT to maximize independence prior to discharge.  Recommend Inpatient Rehab consult with goal of mod I level to return home.    Follow Up Recommendations CIR;Supervision/Assistance - 24 hour    Equipment Recommendations  Wheelchair (measurements PT);Wheelchair cushion (measurements PT)    Recommendations for Other Services Rehab consult;OT consult     Precautions / Restrictions Precautions Precautions: Fall Restrictions Weight Bearing Restrictions: Yes RLE Weight Bearing: Non weight bearing      Mobility  Bed Mobility Overal bed mobility: Needs Assistance Bed Mobility: Supine to Sit;Sit to Supine     Supine to sit: Supervision Sit to supine: Supervision   General bed mobility comments: Supervision for safety.  Patient using bed rails for mobility.  Once upright, patient with good sitting balance.  Transfers                 General transfer comment: Patient declined attempts to transfer/stand due to pain in stump and dressing "limiting my movement".  Ambulation/Gait                Stairs            Wheelchair Mobility    Modified Rankin (Stroke Patients Only)       Balance                                             Pertinent Vitals/Pain Pain Assessment: 0-10 Pain Score: 7  Pain Location: Rt stump Pain Descriptors / Indicators: Aching;Sore Pain Intervention(s): Repositioned;Limited activity within patient's tolerance    Home Living Family/patient expects to be discharged to:: Private residence Living  Arrangements: Spouse/significant other (Girlfriend) Available Help at Discharge: Family;Available PRN/intermittently (Girlfriend works 4:30 am to 1:30 pm.) Type of Home: House Home Access: Level entry     Home Layout: Two level;Bed/bath upstairs;1/2 bath on main level Home Equipment: Walker - 2 wheels;Crutches;Bedside commode      Prior Function Level of Independence: Independent with assistive device(s)         Comments: Using crutches and RW ptan for ambulation.     Hand Dominance        Extremity/Trunk Assessment   Upper Extremity Assessment: Overall WFL for tasks assessed           Lower Extremity Assessment: RLE deficits/detail RLE Deficits / Details: S/P Rt BKA.  Able to lift LE against gravity.  Unable to fully extend knee (quads grossly 3-/5)    Cervical / Trunk Assessment: Normal  Communication   Communication: No difficulties  Cognition Arousal/Alertness: Awake/alert Behavior During Therapy: WFL for tasks assessed/performed Overall Cognitive Status: Within Functional Limits for tasks assessed                      General Comments      Exercises Amputee Exercises Quad Sets: AROM;Right;5 reps;Supine Hip Flexion/Marching: AROM;Right;5 reps;Supine Knee Flexion: AROM;Right;5 reps;Supine Knee Extension: AROM;Right;5 reps;Supine Straight Leg Raises: AROM;Right;5 reps;Supine  Assessment/Plan    PT Assessment Patient needs continued PT services  PT Diagnosis Difficulty walking;Acute pain   PT Problem List Decreased strength;Decreased range of motion;Decreased activity tolerance;Decreased balance;Decreased mobility;Decreased knowledge of use of DME;Decreased knowledge of precautions;Pain  PT Treatment Interventions DME instruction;Gait training;Functional mobility training;Therapeutic activities;Therapeutic exercise;Balance training;Patient/family education;Wheelchair mobility training   PT Goals (Current goals can be found in the Care Plan  section) Acute Rehab PT Goals Patient Stated Goal: To walk PT Goal Formulation: With patient Time For Goal Achievement: 09/27/14 Potential to Achieve Goals: Good    Frequency Min 3X/week   Barriers to discharge Decreased caregiver support;Inaccessible home environment Girfriend works during day - patient will be home alone until 2:00 pm.  Bed and bath upstairs - 1 flight    Co-evaluation               End of Session Equipment Utilized During Treatment: Oxygen Activity Tolerance: Patient limited by pain Patient left: in bed;with call bell/phone within reach;with family/visitor present           Time: 4098-11911156-1211 PT Time Calculation (min): 15 min   Charges:   PT Evaluation $Initial PT Evaluation Tier I: 1 Procedure PT Treatments $Therapeutic Activity: 8-22 mins   PT G Codes:          Marc Taylor, Marc Taylor 09/20/2014, 12:29 PM Durenda HurtSusan Taylor. Renaldo Taylor, PT, [facility] Acute Rehab Services Pager [phone]

## 2014-09-20 NOTE — Plan of Care (Signed)
Problem: Phase II Progression Outcomes Goal: Foley discontinued Outcome: Completed/Met Date Met:  09/20/14

## 2014-09-20 NOTE — Plan of Care (Signed)
Problem: Phase II Progression Outcomes Goal: Dressings dry/intact Outcome: Completed/Met Date Met:  09/20/14 Goal: Tolerating diet Outcome: Completed/Met Date Met:  09/20/14

## 2014-09-20 NOTE — Progress Notes (Signed)
Rehab admissions - I met with pt and his sister/neice in follow up to rehab MD consult to explain the possibility of inpatient rehab. Questions were answered and informational brochures were given. Both pt and his family are interested in coming to inpatient rehab.  I spoke with Nira Conn, case manager about pt's case and Heather shared contact information for worker's comp:   Tourist information centre manager for worker's comp: Glee Arvin, RN with [employer]. Cell [phone] or office [phone]. I called and left a message with [employer] with my contact information.  I will check on pt's status on Monday. We will consider possible inpatient rehab admission pending his medical clearance, authorization from worker's comp and pending our bed availability.  Pt and his family are aware of the above and I will check on pt's status on Monday.  I updated [place], with social work on pt's status as well.  Thanks.  Nanetta Batty, PT Rehabilitation Admissions Coordinator [phone]

## 2014-09-20 NOTE — Progress Notes (Signed)
Received prescreen request for inpatient rehab from PT and noted that rehab MD order has already been placed. We will wait for rehab MD to complete consult and I will follow up at that time.  I did speak with Herbert SetaHeather, case manager about pt's case and received details about worker's comp Sports coachcase manager.  Thanks.  Juliann MuleJanine Carnell Beavers, PT Rehabilitation Admissions Coordinator [phone]'

## 2014-09-20 NOTE — Progress Notes (Signed)
Patient ID: Marc Taylor, male   DOB: 1961-05-20, 53 y.o.   MRN: [id number] Postoperative day 1 status post right transtibial amputation. Patient did have increased pain postoperatively and he was placed on a Dilaudid PCA. Anticipate this can be discontinued tomorrow morning. Orders are written for both inpatient and outpatient rehabilitation. Anticipate patient could benefit most from inpatient rehabilitation with home health therapy after discharge. Patient was given instructions to aggressively work on knee extension.

## 2014-09-21 LAB — GLUCOSE, CAPILLARY
Glucose-Capillary: 160 mg/dL — ABNORMAL HIGH (ref 70–99)
Glucose-Capillary: 90 mg/dL (ref 70–99)
Glucose-Capillary: 105 mg/dL — ABNORMAL HIGH (ref 70–99)
Glucose-Capillary: 97 mg/dL (ref 70–99)

## 2014-09-21 NOTE — Progress Notes (Signed)
Subjective: 2 Days Post-Op Procedure(s) (LRB): AMPUTATION BELOW KNEE RIGHT (Right) Patient reports pain as mild.    Objective: Vital signs in last 24 hours: Temp:  [98.5 F (36.9 C)-99.7 F (37.6 C)] 98.5 F (36.9 C) (11/07 0721) Pulse Rate:  [83-96] 92 (11/07 0721) Resp:  [14-25] 24 (11/07 0815) BP: (108-134)/(61-71) 120/61 mmHg (11/07 0721) SpO2:  [96 %-100 %] 96 % (11/07 0815)  Intake/Output from previous day: 11/06 0701 - 11/07 0700 In: 1400 [P.O.:1320; I.V.:80] Out: 1150 [Urine:1150] Intake/Output this shift: Total I/O In: 120 [P.O.:120] Out: -    Recent Labs  09/18/14 1104  HGB 11.5*    Recent Labs  09/18/14 1104  WBC 9.4  RBC 3.99*  HCT 33.7*  PLT 513*    Recent Labs  09/18/14 1104  NA 135*  K 4.7  CL 99  CO2 23  BUN 21  CREATININE 0.83  GLUCOSE 109*  CALCIUM 9.3    Recent Labs  09/18/14 1104  INR 1.05    Right BKA stump dressing clean, dry  Assessment/Plan: 2 Days Post-Op Procedure(s) (LRB): AMPUTATION BELOW KNEE RIGHT (Right) Up with therapy, awaiting rehab eval on Mon  WHITFIELD, PETER W 09/21/2014, 10:49 AM

## 2014-09-21 NOTE — Progress Notes (Signed)
Physical Therapy Treatment Patient Details Name: Marc Taylor MRN: [id number] DOB: [birth year] Today's Date: 09/21/2014    History of Present Illness Patient is a 53 yo male admitted 09/18/14 with gangrene Rt foot.  Patient has had mult surgeries following a crush injury to Rt foot.  Now s/p Rt BKA on 09/19/14.  PMH:  HTN, DM    PT Comments    Patient making progress with mobility.  Able to stand today, and perform exercises with pain at 4/10.  Continue to recommend Inpatient Rehab at discharge.  Follow Up Recommendations  CIR;Supervision/Assistance - 24 hour     Equipment Recommendations  Wheelchair (measurements PT);Wheelchair cushion (measurements PT)    Recommendations for Other Services Rehab consult;OT consult     Precautions / Restrictions Precautions Precautions: Fall Restrictions Weight Bearing Restrictions: Yes RLE Weight Bearing: Non weight bearing    Mobility  Bed Mobility Overal bed mobility: Needs Assistance Bed Mobility: Supine to Sit;Sit to Supine     Supine to sit: Supervision Sit to supine: Supervision   General bed mobility comments: No physical assist needed.  Patient using rails, with HOB elevated.  Transfers Overall transfer level: Needs assistance Equipment used: Rolling walker (2 wheeled) Transfers: Sit to/from Stand Sit to Stand: Min assist         General transfer comment: Verbal cues for hand placement and technique.  Assist to rise to standing and for balance.  In standing, had patient perform Rt hip extension exercise, and try to relax RLE.  Patient holds RLE with hip and knee flexed.  Patient able to maintain standing balance with use of RW and min guard assist.  Ambulation/Gait                 Stairs            Wheelchair Mobility    Modified Rankin (Stroke Patients Only)       Balance                                    Cognition Arousal/Alertness: Awake/alert Behavior During Therapy: WFL for  tasks assessed/performed Overall Cognitive Status: Within Functional Limits for tasks assessed                      Exercises Amputee Exercises Quad Sets: AROM;Right;10 reps;Supine Hip Extension: AROM;Right;10 reps;Standing Hip ABduction/ADduction: AROM;Right;10 reps;Supine Hip Flexion/Marching: AROM;Right;10 reps;Seated Knee Flexion: AROM;Right;10 reps;Seated Knee Extension: AROM;Right;10 reps;Seated Straight Leg Raises: AROM;Right;10 reps;Supine    General Comments        Pertinent Vitals/Pain Pain Assessment: 0-10 Pain Score: 4  Pain Location: Rt stump Pain Descriptors / Indicators: Aching;Sore Pain Intervention(s): Repositioned;PCA encouraged (Elevated at end of session)    Home Living                      Prior Function            PT Goals (current goals can now be found in the care plan section) Progress towards PT goals: Progressing toward goals    Frequency  Min 3X/week    PT Plan Current plan remains appropriate    Co-evaluation             End of Session Equipment Utilized During Treatment: Oxygen Activity Tolerance: Patient limited by pain;Patient limited by fatigue Patient left: in bed;with call bell/phone within reach;with family/visitor present     Time: [id number] PT  Time Calculation (min): 24 min  Charges:  $Therapeutic Exercise: 8-22 mins $Therapeutic Activity: 8-22 mins                    G Codes:      Vena AustriaDavis, Nohely Whitehorn H 09/21/2014, 4:03 PM Durenda HurtSusan H. Renaldo Fiddleravis, PT, [facility] Acute Rehab Services Pager [phone]

## 2014-09-22 LAB — GLUCOSE, CAPILLARY
Glucose-Capillary: 123 mg/dL — ABNORMAL HIGH (ref 70–99)
Glucose-Capillary: 96 mg/dL (ref 70–99)
Glucose-Capillary: 96 mg/dL (ref 70–99)
Glucose-Capillary: 107 mg/dL — ABNORMAL HIGH (ref 70–99)

## 2014-09-22 NOTE — Plan of Care (Signed)
Problem: Phase II Progression Outcomes Goal: Return of bowel function (flatus, BM) IF ABDOMINAL SURGERY:  Outcome: Completed/Met Date Met:  09/22/14

## 2014-09-22 NOTE — Progress Notes (Signed)
Subjective: 3 Days Post-Op Procedure(s) (LRB): AMPUTATION BELOW KNEE RIGHT (Right) Patient reports pain as mild and moderate.    Objective: Vital signs in last 24 hours: Temp:  [98.3 F (36.8 C)-99.4 F (37.4 C)] 98.3 F (36.8 C) (11/08 0531) Pulse Rate:  [86-126] 86 (11/08 0531) Resp:  [18-30] 19 (11/08 0812) BP: (108-126)/(59-67) 114/59 mmHg (11/08 0531) SpO2:  [92 %-100 %] 95 % (11/08 0812)  Intake/Output from previous day: 11/07 0701 - 11/08 0700 In: 400 [P.O.:240; I.V.:160] Out: -  Intake/Output this shift:    No results for input(s): HGB in the last 72 hours. No results for input(s): WBC, RBC, HCT, PLT in the last 72 hours. No results for input(s): NA, K, CL, CO2, BUN, CREATININE, GLUCOSE, CALCIUM in the last 72 hours. No results for input(s): LABPT, INR in the last 72 hours.    Assessment/Plan: 3 Days Post-Op Procedure(s) (LRB): AMPUTATION BELOW KNEE RIGHT (Right) Up with therapy D/C IV fluids Possible Discharge to CIR if allowed  York HospitalETRARCA,BRIAN 09/22/2014, 10:53 AM

## 2014-09-23 LAB — GLUCOSE, CAPILLARY
Glucose-Capillary: 141 mg/dL — ABNORMAL HIGH (ref 70–99)
Glucose-Capillary: 94 mg/dL (ref 70–99)

## 2014-09-23 MED ORDER — GABAPENTIN 300 MG PO CAPS: 300.0000 mg | ORAL_CAPSULE | Freq: Three times a day (TID) | ORAL | Status: AC

## 2014-09-23 MED ORDER — METHOCARBAMOL 500 MG PO TABS: 500.0000 mg | ORAL_TABLET | Freq: Four times a day (QID) | ORAL | Status: AC | PRN

## 2014-09-23 NOTE — Progress Notes (Signed)
PT Cancellation Note  Patient Details Name: Marc Taylor MRN: [id number] DOB: [birth year]   Cancelled Treatment:    Reason Eval/Treat Not Completed: Patient declined, no reason specified. Upon entering room, patient had just started eating lunch and did not want to attempt to work with PTA at this time. Patient stated (and RN confirmed) that he had hopped into bathroom and washed up earlier today. Patient was encouraged to complete therex in bed. There is a [facility]. Will follow up with patient in AM if not DC today   Menelik Mcfarren, Adline PotterJulia Elizabeth 09/23/2014, 1:57 PM

## 2014-09-23 NOTE — Clinical Social Work Note (Signed)
CSW spoke with patients Workers Comp case manager- Alona BeneJoyce [phone] to discuss SNF placement- Alona BeneJoyce picks [facility] for patient's rehab.  CSW contacted [facility] and provided with additional patient clinicals.    Rate adjuster for workers comp is working with [employer] on [employer]urance approval- need approval before DC.  CSW will continue to follow.  Merlyn LotJenna Holoman, LCSWA Clinical Social Worker [phone]

## 2014-09-23 NOTE — Progress Notes (Signed)
Rehab admissions - I have been following pt's case and unfortunately, we have no available rehab beds today and likely no available beds tomorrow. I communicated this with Nira Conn, case Freight forwarder and Eliezer Lofts, Education officer, museum. I also spoke directly with Glee Arvin, RN with worker's comp. Per Nira Conn, pt is medically ready for discharge per Dr. Sharol Given and the plan will need to be for SNF now that we have no available inpatient rehab bed. Nira Conn and Eliezer Lofts are aware of the plan and Eliezer Lofts to coordinate with worker's comp for SNF.  I updated pt directly and he had no further questions. I explained that social worker would now be following up to plan for SNF. I also called and updated pt's sister who I met on Friday [facility]). I answered her further questions and she was in agreement to SNF.  I will now sign off pt's case in light of likely DC to SNF today. Please call me with any questions. Thanks.  Nanetta Batty, PT Rehabilitation Admissions Coordinator [phone]

## 2014-09-23 NOTE — Progress Notes (Signed)
Patient ID: Marc Taylor, male   DOB: 1961-08-15, 53 y.o.   MRN: [id number] Status post transtibial amputation for crush injury to the foot. Anticipate discharge to inpatient rehabilitation.

## 2014-09-23 NOTE — Progress Notes (Signed)
Discharge instructions given to pt and pt's wife. Appropriate questions were asked. All medications reviewed.

## 2014-09-24 NOTE — Care Management (Signed)
Discharge summary and order for bedside commode faxed to worker's comp [employer]. Ronny FlurryHeather Demarlo Riojas RN BSN [phone]

## 2014-09-24 NOTE — Discharge Summary (Signed)
Physician Discharge Summary  Patient ID: Marc [name] MRN: [id number] DOB/AGE: 1961/10/29 53 y.o.  Admit date: 09/18/2014 Discharge date: 09/24/2014  Admission Diagnoses: gangrene right foot secondary to crush injury  Discharge Diagnoses:  Active Problems:   Gas gangrene of foot   Discharged Condition: stable  Hospital Course: patient's hospital course was essentially unremarkable. Patient underwent foot salvage intervention however the ischemic gangrenous changes to the foot were to severe and patient ultimately required a transtibial amputation. Patient was able to ambulate independently and was discharged to home in stable condition.  Consults: None  Significant Diagnostic Studies: labs: routine labs  Treatments: surgery: see operative notes  Discharge Exam: Blood pressure 109/64, pulse 76, temperature 97.8 F (36.6 C), temperature source Oral, resp. rate 16, height 5\' 9"  (1.753 m), weight 117.935 kg (260 lb), SpO2 98 %. Incision/Wound:dressing clean dry and intact  Disposition: 01-Home or Self Care  Discharge Instructions    Ambulatory referral to Home Health    Complete by:  As directed   Please evaluate Marc Taylor for [facility].  Disciplines requested: Physical Therapy  Services to provide: Strengthening Exercises  Physician to follow patient's care (the person listed here will be responsible for signing ongoing orders): Referring Provider  Requested Start of Care Date: Tomorrow  I certify that this patient is under my care and that I, or a Nurse Practitioner or Physician's Assistant working with me, had a face-to-face encounter that meets the physician face-to-face requirements with patient on 09/23/2014. The encounter with the patient was in whole, or in part for the following medical condition(s) which is the primary reason for home health care (List medical condition). BKA  Special Instructions:  Gait training  Does the patient have Medicare  or Medicaid?:  No  The encounter with the patient was in whole, or in part, for the following medical condition, which is the primary reason for home health care:  BKA  Reason for Medically Necessary Home Health Services:  Therapy- Investment banker, operationalGait Training, Patent examinerTransfer Training and Stair Training  My clinical findings support the need for the above services:  Unable to leave home safely without assistance and/or assistive device  I certify that, based on my findings, the following services are medically necessary home health services:  Physical therapy  Further, I certify that my clinical findings support that this patient is homebound due to:  Unable to leave home safely without assistance     Call MD / Call 911    Complete by:  As directed   If you experience chest pain or shortness of breath, CALL 911 and be transported to the hospital emergency room.  If you develope a fever above 101 F, pus (white drainage) or increased drainage or redness at the wound, or calf pain, call your surgeon's office.     Constipation Prevention    Complete by:  As directed   Drink plenty of fluids.  Prune juice may be helpful.  You may use a stool softener, such as Colace (over the counter) 100 mg twice a Taylor.  Use MiraLax (over the counter) for constipation as needed.     Diet - low sodium heart healthy    Complete by:  As directed      Increase activity slowly as tolerated    Complete by:  As directed             Medication List    STOP taking these medications        doxycycline 50 MG  capsule  Commonly known as:  VIBRAMYCIN      TAKE these medications        aspirin EC 325 MG tablet  Take 1 tablet (325 mg total) by mouth daily.     CINNAMON PO  Take 2 tablets by mouth daily.     fish oil-omega-3 fatty acids 1000 MG capsule  Take 2 g by mouth daily.     gabapentin 300 MG capsule  Commonly known as:  NEURONTIN  Take 1 capsule (300 mg total) by mouth 3 (three) times daily.     GARLIC PO  Take 2 tablets  by mouth daily.     glimepiride 4 MG tablet  Commonly known as:  AMARYL  Take 4 mg by mouth daily with breakfast.     lisinopril-hydrochlorothiazide 10-12.5 MG per tablet  Commonly known as:  PRINZIDE,ZESTORETIC  Take 1 tablet by mouth daily.     metFORMIN 1000 MG tablet  Commonly known as:  GLUCOPHAGE  Take 1,000 mg by mouth 2 (two) times daily with a meal.     methocarbamol 500 MG tablet  Commonly known as:  ROBAXIN  Take 1 tablet (500 mg total) by mouth every 6 (six) hours as needed for muscle spasms.     oxyCODONE-acetaminophen 5-325 MG per tablet  Commonly known as:  PERCOCET/ROXICET  Take 1-2 tablets by mouth every 6 (six) hours as needed for moderate pain or severe pain.           Follow-up Information    Follow up with [name],[name], MD In 2 weeks.   Specialty:  Orthopedic Surgery   Contact information:   [address] [place] [address] [phone]       Signed: Nadara MustardDUDA,[name] 09/24/2014, 6:27 AM

## 2014-09-26 NOTE — Addendum Note (Signed)
Addendum  created 09/26/14 1559 by Kerby Noraavid A Codee Tutson, MD   Modules edited: Anesthesia Attestations

## 2014-10-23 ENCOUNTER — Ambulatory Visit (INDEPENDENT_AMBULATORY_CARE_PROVIDER_SITE_OTHER): Payer: 59 | Admitting: Vascular Surgery

## 2014-10-23 ENCOUNTER — Encounter: Payer: Self-pay | Admitting: Vascular Surgery

## 2014-10-23 VITALS — BP 143/81 | HR 104 | Ht 69.0 in | Wt 249.0 lb

## 2014-10-23 DIAGNOSIS — S81801A Unspecified open wound, right lower leg, initial encounter: Secondary | ICD-10-CM | POA: Insufficient documentation

## 2014-10-23 NOTE — Progress Notes (Signed)
   Patient name: Marc Taylor MRN: [id number] DOB: 04-27-1961 Sex: male  REASON FOR VISIT: To evaluate wound wounds on right lobe of the knee amputation.  HPI: Marc DayGeorge Basurto is a 53 y.o. male who underwent a right transtibial amputation on 09/19/2014 by Dr. Lajoyce Cornersuda. He required amputation because of a crush injury to the right foot which could not be salvaged. He presented to the office today because a family member requested that we look at the wound given his history of diabetes. He has been getting dressing changes daily with Silvadene to soften up the wound. He denies fever or chills.  REVIEW OF SYSTEMS: Arly.Keller[X ] denotes positive finding; [  ] denotes negative finding  CARDIOVASCULAR:  [ ]  chest pain   [ ]  dyspnea on exertion    CONSTITUTIONAL:  [ ]  fever   [ ]  chills  PHYSICAL EXAM: Filed Vitals:   10/23/14 1006  BP: 143/81  Pulse: 104  Height: 5\' 9"  (1.753 m)  Weight: 249 lb (112.946 kg)  SpO2: 100%   Body mass index is 36.75 kg/(m^2). GENERAL: The patient is a well-nourished male, in no acute distress. The vital signs are documented above. CARDIOVASCULAR: There is a regular rate and rhythm. He has palpable femoral pulses and palpable popliteal pulses. PULMONARY: There is good air exchange bilaterally without wheezing or rales. His right below the knee amputation site has areas with necrotic tissue both along the lateral aspect and also the medial aspect of the wound. I did some excisional debridement in the office but did not want to go too deep because I did not want to cut the deeper sutures which could result in the wound separating. We then packed the wound with wet-to-dry 4 x 4's.  MEDICAL ISSUES: Slowly healing right below the knee amputation. I reassured the patient that he is in excellent care and that the wound will likely take some time to heal given his diabetes but it looks like it will heal ultimately. He will continue his care with Dr. Lajoyce Cornersuda.  Kenzy Campoverde S [facility]: [phone]

## 2014-10-30 ENCOUNTER — Encounter (HOSPITAL_COMMUNITY): Payer: Self-pay | Admitting: Pharmacy Technician

## 2014-10-31 ENCOUNTER — Encounter (HOSPITAL_COMMUNITY): Payer: Self-pay | Admitting: *Deleted

## 2014-10-31 ENCOUNTER — Other Ambulatory Visit (HOSPITAL_COMMUNITY): Payer: Self-pay | Admitting: Orthopedic Surgery

## 2014-10-31 NOTE — Progress Notes (Signed)
   10/31/14 1217  OBSTRUCTIVE SLEEP APNEA  Have you ever been diagnosed with sleep apnea through a sleep study? No  Do you snore loudly (loud enough to be heard through closed doors)?  0  Do you often feel tired, fatigued, or sleepy during the daytime? 1 (no that he is not active)  Has anyone observed you stop breathing during your sleep? 0  Do you have, or are you being treated for high blood pressure? 1  BMI more than 35 kg/m2? 1  Age over 52 years old? 1  Neck circumference greater than 40 cm/16 inches? 1 (18)  Obstructive Sleep Apnea Score 5  Score 4 or greater  Results sent to PCP

## 2014-11-01 ENCOUNTER — Encounter (HOSPITAL_COMMUNITY): Payer: Self-pay | Admitting: *Deleted

## 2014-11-01 ENCOUNTER — Encounter (HOSPITAL_COMMUNITY)
Admission: RE | Disposition: A | Discharge status: Home / Self Care | Payer: Self-pay | Source: Ambulatory Visit | Attending: Orthopedic Surgery

## 2014-11-01 ENCOUNTER — Ambulatory Visit (HOSPITAL_COMMUNITY): Payer: Worker's Compensation | Admitting: Anesthesiology

## 2014-11-01 ENCOUNTER — Ambulatory Visit (HOSPITAL_COMMUNITY)
Admission: RE | Admit: 2014-11-01 | Discharge: 2014-11-01 | Disposition: A | Discharge status: Home / Self Care | Payer: Worker's Compensation | Source: Ambulatory Visit | Attending: Orthopedic Surgery | Admitting: Orthopedic Surgery

## 2014-11-01 DIAGNOSIS — T8781 Dehiscence of amputation stump: Secondary | ICD-10-CM | POA: Insufficient documentation

## 2014-11-01 DIAGNOSIS — Z8249 Family history of ischemic heart disease and other diseases of the circulatory system: Secondary | ICD-10-CM | POA: Insufficient documentation

## 2014-11-01 DIAGNOSIS — Z89519 Acquired absence of unspecified leg below knee: Secondary | ICD-10-CM | POA: Diagnosis not present

## 2014-11-01 DIAGNOSIS — Z89511 Acquired absence of right leg below knee: Secondary | ICD-10-CM | POA: Insufficient documentation

## 2014-11-01 DIAGNOSIS — K5900 Constipation, unspecified: Secondary | ICD-10-CM | POA: Insufficient documentation

## 2014-11-01 DIAGNOSIS — Z833 Family history of diabetes mellitus: Secondary | ICD-10-CM | POA: Diagnosis not present

## 2014-11-01 DIAGNOSIS — R739 Hyperglycemia, unspecified: Secondary | ICD-10-CM | POA: Diagnosis not present

## 2014-11-01 DIAGNOSIS — E119 Type 2 diabetes mellitus without complications: Secondary | ICD-10-CM | POA: Diagnosis not present

## 2014-11-01 DIAGNOSIS — I10 Essential (primary) hypertension: Secondary | ICD-10-CM | POA: Insufficient documentation

## 2014-11-01 HISTORY — DX: Constipation, unspecified: K59.00

## 2014-11-01 HISTORY — PX: STUMP REVISION: SHX6102

## 2014-11-01 LAB — GLUCOSE, CAPILLARY
Glucose-Capillary: 151 mg/dL — ABNORMAL HIGH (ref 70–99)
Glucose-Capillary: 121 mg/dL — ABNORMAL HIGH (ref 70–99)

## 2014-11-01 LAB — CBC
HCT: 42.1 % (ref 39.0–52.0)
Hemoglobin: 14.3 g/dL (ref 13.0–17.0)
MCH: 27.4 pg (ref 26.0–34.0)
MCHC: 34 g/dL (ref 30.0–36.0)
MCV: 80.8 fL (ref 78.0–100.0)
Platelets: 296 10*3/uL (ref 150–400)
RBC: 5.21 MIL/uL (ref 4.22–5.81)
RDW: 13.3 % (ref 11.5–15.5)
WBC: 6.4 10*3/uL (ref 4.0–10.5)

## 2014-11-01 LAB — BASIC METABOLIC PANEL
Anion gap: 15 (ref 5–15)
BUN: 20 mg/dL (ref 6–23)
CO2: 21 mEq/L (ref 19–32)
Calcium: 9.7 mg/dL (ref 8.4–10.5)
Chloride: 101 mEq/L (ref 96–112)
Creatinine, Ser: 0.75 mg/dL (ref 0.50–1.35)
GFR calc Af Amer: >90 mL/min (ref >90)
GFR calc non Af Amer: >90 mL/min (ref >90)
Glucose, Bld: 142 mg/dL — ABNORMAL HIGH (ref 70–99)
Potassium: 4.6 mEq/L (ref 3.7–5.3)
Sodium: 137 mEq/L (ref 137–147)

## 2014-11-01 SURGERY — REVISION, AMPUTATION SITE
Anesthesia: Regional | Site: Knee | Laterality: Right

## 2014-11-01 MED ORDER — FENTANYL CITRATE 0.05 MG/ML IJ SOLN
INTRAMUSCULAR | Status: DC | PRN
  Administered 2014-11-01 (×4): 25 ug via INTRAVENOUS
  Administered 2014-11-01 (×2): 50 ug via INTRAVENOUS

## 2014-11-01 MED ORDER — MEPERIDINE HCL 25 MG/ML IJ SOLN: 6.2500 mg | INTRAMUSCULAR | Status: DC | PRN

## 2014-11-01 MED ORDER — FENTANYL CITRATE 0.05 MG/ML IJ SOLN
INTRAMUSCULAR | Status: AC
  Filled 2014-11-01: qty 2

## 2014-11-01 MED ORDER — CEFAZOLIN SODIUM-DEXTROSE 2-3 GM-% IV SOLR
INTRAVENOUS | Status: AC
  Administered 2014-11-01: 2 g via INTRAVENOUS
  Filled 2014-11-01: qty 50

## 2014-11-01 MED ORDER — LACTATED RINGERS IV SOLN
INTRAVENOUS | Status: DC | PRN
  Administered 2014-11-01 (×2): via INTRAVENOUS

## 2014-11-01 MED ORDER — ARTIFICIAL TEARS OP OINT
TOPICAL_OINTMENT | OPHTHALMIC | Status: AC
  Filled 2014-11-01: qty 3.5

## 2014-11-01 MED ORDER — ONDANSETRON HCL 4 MG/2ML IJ SOLN
INTRAMUSCULAR | Status: AC
Start: 2014-11-01 — End: 2014-11-01
  Filled 2014-11-01: qty 2

## 2014-11-01 MED ORDER — PROMETHAZINE HCL 25 MG/ML IJ SOLN: 6.2500 mg | INTRAMUSCULAR | Status: DC | PRN

## 2014-11-01 MED ORDER — ROPIVACAINE HCL 5 MG/ML IJ SOLN
INTRAMUSCULAR | Status: DC | PRN
  Administered 2014-11-01: 40 mL via EPIDURAL

## 2014-11-01 MED ORDER — LIDOCAINE HCL (CARDIAC) 20 MG/ML IV SOLN
INTRAVENOUS | Status: DC | PRN
  Administered 2014-11-01: 50 mg via INTRAVENOUS

## 2014-11-01 MED ORDER — MIDAZOLAM HCL 2 MG/2ML IJ SOLN
INTRAMUSCULAR | Status: AC
  Filled 2014-11-01: qty 2

## 2014-11-01 MED ORDER — FENTANYL CITRATE 0.05 MG/ML IJ SOLN
50.0000 ug | INTRAMUSCULAR | Status: DC | PRN
  Administered 2014-11-01: 100 ug via INTRAVENOUS
  Administered 2014-11-01 (×2): 50 ug via INTRAVENOUS

## 2014-11-01 MED ORDER — ONDANSETRON HCL 4 MG/2ML IJ SOLN
INTRAMUSCULAR | Status: DC | PRN
  Administered 2014-11-01: 4 mg via INTRAVENOUS

## 2014-11-01 MED ORDER — ROCURONIUM BROMIDE 50 MG/5ML IV SOLN
INTRAVENOUS | Status: AC
Start: 2014-11-01 — End: 2014-11-01
  Filled 2014-11-01: qty 1

## 2014-11-01 MED ORDER — 0.9 % SODIUM CHLORIDE (POUR BTL) OPTIME
TOPICAL | Status: DC | PRN
  Administered 2014-11-01: 1000 mL

## 2014-11-01 MED ORDER — GLYCOPYRROLATE 0.2 MG/ML IJ SOLN
INTRAMUSCULAR | Status: AC
  Filled 2014-11-01: qty 2

## 2014-11-01 MED ORDER — MIDAZOLAM HCL 2 MG/2ML IJ SOLN
1.0000 mg | INTRAMUSCULAR | Status: DC | PRN
  Administered 2014-11-01: 2 mg via INTRAVENOUS

## 2014-11-01 MED ORDER — PROPOFOL 10 MG/ML IV BOLUS
INTRAVENOUS | Status: AC
  Filled 2014-11-01: qty 20

## 2014-11-01 MED ORDER — LACTATED RINGERS IV SOLN
INTRAVENOUS | Status: DC
  Administered 2014-11-01: 13:00:00 via INTRAVENOUS

## 2014-11-01 MED ORDER — HYDROMORPHONE HCL 1 MG/ML IJ SOLN: 0.2500 mg | INTRAMUSCULAR | Status: DC | PRN

## 2014-11-01 MED ORDER — PROPOFOL 10 MG/ML IV BOLUS
INTRAVENOUS | Status: DC | PRN
  Administered 2014-11-01: 200 mg via INTRAVENOUS

## 2014-11-01 MED ORDER — FENTANYL CITRATE 0.05 MG/ML IJ SOLN
INTRAMUSCULAR | Status: AC
  Filled 2014-11-01: qty 5

## 2014-11-01 MED ORDER — CEFAZOLIN SODIUM-DEXTROSE 2-3 GM-% IV SOLR: 2.0000 g | INTRAVENOUS | Status: DC

## 2014-11-01 SURGICAL SUPPLY — 35 items
BANDAGE ESMARK 6X9 LF (GAUZE/BANDAGES/DRESSINGS) ×1 IMPLANT
BLADE SAW RECIP 87.9 MT (BLADE) ×3 IMPLANT
BNDG COHESIVE 4X5 TAN STRL (GAUZE/BANDAGES/DRESSINGS) ×3 IMPLANT
BNDG COHESIVE 6X5 TAN STRL LF (GAUZE/BANDAGES/DRESSINGS) ×3 IMPLANT
BNDG ESMARK 6X9 LF (GAUZE/BANDAGES/DRESSINGS) ×3
BNDG GAUZE ELAST 4 BULKY (GAUZE/BANDAGES/DRESSINGS) ×3 IMPLANT
COVER SURGICAL LIGHT HANDLE (MISCELLANEOUS) ×6 IMPLANT
DRAPE EXTREMITY T 121X128X90 (DRAPE) ×3 IMPLANT
DRAPE PROXIMA HALF (DRAPES) ×6 IMPLANT
DRAPE U-SHAPE 47X51 STRL (DRAPES) ×6 IMPLANT
DRSG ADAPTIC 3X8 NADH LF (GAUZE/BANDAGES/DRESSINGS) ×3 IMPLANT
DRSG PAD ABDOMINAL 8X10 ST (GAUZE/BANDAGES/DRESSINGS) ×9 IMPLANT
DURAPREP 26ML APPLICATOR (WOUND CARE) ×3 IMPLANT
ELECT CAUTERY BLADE 6.4 (BLADE) ×3 IMPLANT
ELECT REM PT RETURN 9FT ADLT (ELECTROSURGICAL) ×3
ELECTRODE REM PT RTRN 9FT ADLT (ELECTROSURGICAL) ×1 IMPLANT
GAUZE SPONGE 4X4 12PLY STRL (GAUZE/BANDAGES/DRESSINGS) ×3 IMPLANT
GLOVE BIOGEL PI IND STRL 9 (GLOVE) ×1 IMPLANT
GLOVE BIOGEL PI INDICATOR 9 (GLOVE) ×2
GLOVE SURG ORTHO 9.0 STRL STRW (GLOVE) ×3 IMPLANT
GOWN STRL REUS W/ TWL XL LVL3 (GOWN DISPOSABLE) ×2 IMPLANT
GOWN STRL REUS W/TWL XL LVL3 (GOWN DISPOSABLE) ×4
KIT BASIN OR (CUSTOM PROCEDURE TRAY) ×3 IMPLANT
KIT ROOM TURNOVER OR (KITS) ×3 IMPLANT
MANIFOLD NEPTUNE II (INSTRUMENTS) ×3 IMPLANT
NS IRRIG 1000ML POUR BTL (IV SOLUTION) ×3 IMPLANT
PACK GENERAL/GYN (CUSTOM PROCEDURE TRAY) ×3 IMPLANT
PAD ARMBOARD 7.5X6 YLW CONV (MISCELLANEOUS) ×6 IMPLANT
SPONGE GAUZE 4X4 12PLY STER LF (GAUZE/BANDAGES/DRESSINGS) ×3 IMPLANT
SPONGE LAP 18X18 X RAY DECT (DISPOSABLE) ×3 IMPLANT
SUT ETHILON 2 0 PSLX (SUTURE) ×6 IMPLANT
SUT SILK 2 0 (SUTURE) ×2
SUT SILK 2-0 18XBRD TIE 12 (SUTURE) ×1 IMPLANT
TOWEL OR 17X24 6PK STRL BLUE (TOWEL DISPOSABLE) ×3 IMPLANT
TOWEL OR 17X26 10 PK STRL BLUE (TOWEL DISPOSABLE) ×3 IMPLANT

## 2014-11-01 NOTE — H&P (Signed)
Marc Taylor is an 53 y.o. male.   Chief Complaint: Dehiscence right transtibial amputation. HPI: Patient is a 53 year old gentleman diabetic insensate neuropathy who has progressive dehiscence of the transtibial amputation and presents at this time for revision.  Past Medical History  Diagnosis Date  . Diabetes mellitus without complication   . Hypertension   . Type II diabetes mellitus     "just dx'd" (08/24/2013)  . History of blood transfusion 1963    "one; I had yellow jaundice" (08/24/2013)  . Compartment syndrome of right foot 09/04/2014  . Fracture of fifth toe, right, closed 09/04/2014  . Constipation     Past Surgical History  Procedure Laterality Date  . Fasciotomy Right 09/04/2014    Procedure: Right Foot FASCIOTOMY and wound vac placement;  Surgeon: [name], MD;  [facility];  Service: Orthopedics;  Laterality: Right;  . Open reduction internal fixation (orif) foot lisfranc fracture Right 09/04/2014    Procedure: OPEN REDUCTION INTERNAL FIXATION (ORIF) FOOT LISFRANC FRACTURE;  Surgeon: [name], MD;  Location: [facility] OR;  Service: Orthopedics;  Laterality: Right;  . I&d extremity Right 09/06/2014    Procedure: Irrigation and Debridement Right Foot, TheraSkin graft and wound vac change out;  Surgeon: Nadara MustardMarcus Bart Ashford V, MD;  Location: [facility] OR;  Service: Orthopedics;  Laterality: Right;  . Amputation Right 09/18/2014    transmetatarsal         [name]  . Amputation Right 09/18/2014    Procedure: Right Midfoot Amputation;  Surgeon: Nadara MustardMarcus Cordale Manera V, MD;  [facility];  Service: Orthopedics;  Laterality: Right;  . Amputation Right 09/19/2014    Procedure: AMPUTATION BELOW KNEE RIGHT;  Surgeon: Nadara MustardMarcus Zane Samson V, MD;  Location: [facility] OR;  Service: Orthopedics;  Laterality: Right;    Family History  Problem Relation Age of Onset  . Cancer Mother   . Liver disease Mother   . Heart attack Mother   . Diabetes type II Father   . Hypertension Father   . Diabetes Father   .  Peripheral vascular disease Father     amputation  . Diabetes type II Sister   . Hypertension Sister    Social History:  reports that he has never smoked. He has never used smokeless tobacco. He reports that he drinks alcohol. He reports that he does not use illicit drugs.  Allergies: No Known Allergies  No prescriptions prior to admission    No results found for this or any previous visit (from the past 48 hour(s)). No results found.  Review of Systems  All other systems reviewed and are negative.   There were no vitals taken for this visit. Physical Exam  Dehiscence right transtibial amputation Assessment/Plan Assessment: Dehiscence right transtibial amputation.  Plan: We'll plan for revision amputation. Risks and benefits were discussed including potential for additional surgery secondary to the diabetic peripheral vascular disease. Patient states he understands and wishes to proceed at this time.  Godson Pollan V 11/01/2014, 6:38 AM

## 2014-11-01 NOTE — Op Note (Signed)
11/01/2014  2:58 PM  PATIENT:  Marc Taylor    PRE-OPERATIVE DIAGNOSIS:  Dehiscence Right Below Knee Amputation  POST-OPERATIVE DIAGNOSIS:  Same  PROCEDURE:  Revision Below Knee Amputation  SURGEON:  Nadara MustardUDA,Kensly Bowmer V, MD  PHYSICIAN ASSISTANT:None ANESTHESIA:   General  PREOPERATIVE INDICATIONS:  Marc DayGeorge Grabbe is a  53 y.o. male with a diagnosis of Dehiscence Right Below Knee Amputation who failed conservative measures and elected for surgical management.    The risks benefits and alternatives were discussed with the patient preoperatively including but not limited to the risks of infection, bleeding, nerve injury, cardiopulmonary complications, the need for revision surgery, among others, and the patient was willing to proceed.  OPERATIVE IMPLANTS: None  OPERATIVE FINDINGS: Good petechial bleeding at amputation revision site.  OPERATIVE PROCEDURE: Patient is a 53 year old gentleman diabetic insensate neuropathy status post crush injury to the right foot who has had progressive dehiscence of the transtibial amputation. Patient presents at this time for revision.  Patient was brought to the operating room and underwent a general anesthetic. After adequate levels of anesthesia were obtained patient's right lower extremity was prepped using DuraPrep draped into a sterile field. A timeout was called. A fishmouth incision was made around the necrotic wound this was carried down to the bone proximally 2 cm of the distal tibia and fibular were resected. Hemostasis was obtained. The wound was irrigated with normal saline. There was good muscle tissue with no ischemic necrosis. The incision was closed using 2-0 nylon. A sterile compressive dressing was applied. Patient was extubated taken to the PACU in stable condition. Plan for discharge to home.

## 2014-11-01 NOTE — Transfer of Care (Signed)
Immediate Anesthesia Transfer of Care Note  Patient: Marc Taylor  Procedure(s) Performed: Procedure(s): Revision Below Knee Amputation (Right)  Patient Location: PACU  Anesthesia Type:GA combined with regional for post-op pain  Level of Consciousness: awake, alert  and oriented  Airway & Oxygen Therapy: Patient Spontanous Breathing and Patient connected to face mask oxygen  Post-op Assessment: Report given to PACU RN  Post vital signs: Reviewed and stable  Complications: No apparent anesthesia complications

## 2014-11-01 NOTE — Anesthesia Preprocedure Evaluation (Addendum)
Anesthesia Evaluation  Patient identified by MRN, date of birth, ID band Patient awake    Reviewed: Allergy & Precautions, H&P , NPO status , Patient's Chart, lab work & pertinent test results  Airway Mallampati: II  TM Distance: >3 FB Neck ROM: Full    Dental   Pulmonary neg pulmonary ROS,  breath sounds clear to auscultation        Cardiovascular hypertension, Pt. on medications Rhythm:Regular Rate:Normal     Neuro/Psych negative neurological ROS  negative psych ROS   GI/Hepatic negative GI ROS, Neg liver ROS,   Endo/Other  diabetes, Type 2, Oral Hypoglycemic Agents  Renal/GU negative Renal ROS     Musculoskeletal   Abdominal   Peds  Hematology   Anesthesia Other Findings   Reproductive/Obstetrics                             Anesthesia Physical  Anesthesia Plan  ASA: III  Anesthesia Plan: General and Regional   Post-op Pain Management:    Induction: Intravenous  Airway Management Planned: LMA  Additional Equipment:   Intra-op Plan:   Post-operative Plan: Extubation in OR  Informed Consent: I have reviewed the patients History and Physical, chart, labs and discussed the procedure including the risks, benefits and alternatives for the proposed anesthesia with the patient or authorized representative who has indicated his/her understanding and acceptance.   Dental advisory given  Plan Discussed with: CRNA and Anesthesiologist  Anesthesia Plan Comments:        Anesthesia Quick Evaluation

## 2014-11-01 NOTE — Anesthesia Procedure Notes (Addendum)
Anesthesia Regional Block:  Femoral nerve block  Pre-Anesthetic Checklist: ,, timeout performed, Correct Patient, Correct Site, Correct Laterality, Correct Procedure, Correct Position, site marked, Risks and benefits discussed,  Surgical consent,  Pre-op evaluation,  At surgeon's request and post-op pain management  Laterality: Right  Prep: chloraprep       Needles:  Injection technique: Single-shot  Needle Type: Stimulator Needle - 80      Needle Gauge: 22 and 22 G  Needle insertion depth: 6 cm   Additional Needles:  Procedures: ultrasound guided (picture in chart) Femoral nerve block Narrative:   Performed by: Personally  Anesthesiologist: Gaylan GeroldGERMEROTH, Vikrant Pryce R   Anesthesia Regional Block:  Popliteal block  Pre-Anesthetic Checklist: ,, timeout performed, Correct Patient, Correct Site, Correct Laterality, Correct Procedure, Correct Position, site marked, Risks and benefits discussed,  Surgical consent,  Pre-op evaluation,  At surgeon's request and post-op pain management  Laterality: Right  Prep: chloraprep       Needles:  Injection technique: Single-shot  Needle Type: Stimiplex     Needle Length: 10cm 10 cm Needle Gauge: 21 and 21 G    Additional Needles:  Procedures: ultrasound guided (picture in chart)  Motor weakness within 5 minutes. Popliteal block Narrative:  Injection made incrementally with aspirations every 5 mL.  Performed by: Personally  Anesthesiologist: Gaylan GeroldGERMEROTH, Tanay Massiah R

## 2014-11-01 NOTE — Anesthesia Postprocedure Evaluation (Signed)
Anesthesia Post Note  Patient: Marc [name]  Procedure(s) Performed: Procedure(s) (LRB): Revision Below Knee Amputation (Right)  Anesthesia type: General  Patient location: PACU  Post pain: Pain level controlled  Post assessment: Post-op Vital signs reviewed  Last Vitals: BP 121/77 mmHg  Pulse 88  Temp(Src) 36.6 C (Oral)  Resp 15  Ht 5\' 9"  (1.753 m)  Wt 249 lb (112.946 kg)  BMI 36.75 kg/m2  SpO2 97%  Post vital signs: Reviewed  Level of consciousness: sedated  Complications: No apparent anesthesia complications

## 2014-11-05 ENCOUNTER — Encounter (HOSPITAL_COMMUNITY): Payer: Self-pay | Admitting: Orthopedic Surgery

## 2014-11-11 DIAGNOSIS — M79651 Pain in right thigh: Secondary | ICD-10-CM

## 2014-11-12 ENCOUNTER — Other Ambulatory Visit (HOSPITAL_COMMUNITY): Payer: Self-pay | Admitting: Orthopedic Surgery

## 2014-11-12 ENCOUNTER — Ambulatory Visit (HOSPITAL_COMMUNITY)
Admission: RE | Admit: 2014-11-12 | Discharge: 2014-11-12 | Disposition: A | Discharge status: Home / Self Care | Payer: Worker's Compensation | Source: Ambulatory Visit | Attending: Orthopedic Surgery | Admitting: Orthopedic Surgery

## 2014-11-12 DIAGNOSIS — Z89511 Acquired absence of right leg below knee: Secondary | ICD-10-CM | POA: Insufficient documentation

## 2014-11-12 DIAGNOSIS — M79651 Pain in right thigh: Secondary | ICD-10-CM | POA: Insufficient documentation

## 2014-11-12 DIAGNOSIS — M7989 Other specified soft tissue disorders: Secondary | ICD-10-CM | POA: Diagnosis present

## 2014-11-12 NOTE — Progress Notes (Addendum)
11-11-2014  RLEV doppler completed.   KE  Negative DVT Rt lower ext.   KE

## 2015-01-16 ENCOUNTER — Ambulatory Visit: Payer: Worker's Compensation | Attending: Orthopedic Surgery | Admitting: Physical Therapy

## 2015-01-16 ENCOUNTER — Encounter: Payer: Self-pay | Admitting: Physical Therapy

## 2015-01-16 DIAGNOSIS — Z89511 Acquired absence of right leg below knee: Secondary | ICD-10-CM

## 2015-01-16 DIAGNOSIS — M545 Low back pain: Secondary | ICD-10-CM | POA: Insufficient documentation

## 2015-01-16 DIAGNOSIS — R269 Unspecified abnormalities of gait and mobility: Secondary | ICD-10-CM | POA: Diagnosis not present

## 2015-01-16 DIAGNOSIS — Z7409 Other reduced mobility: Secondary | ICD-10-CM | POA: Insufficient documentation

## 2015-01-16 DIAGNOSIS — R29818 Other symptoms and signs involving the nervous system: Secondary | ICD-10-CM | POA: Diagnosis not present

## 2015-01-16 DIAGNOSIS — R2689 Other abnormalities of gait and mobility: Secondary | ICD-10-CM
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
## 2015-01-17 ENCOUNTER — Ambulatory Visit: Payer: Worker's Compensation | Attending: Orthopedic Surgery | Admitting: Physical Therapy

## 2015-01-17 ENCOUNTER — Encounter: Payer: Self-pay | Admitting: Physical Therapy

## 2015-01-17 DIAGNOSIS — R29818 Other symptoms and signs involving the nervous system: Secondary | ICD-10-CM | POA: Diagnosis not present

## 2015-01-17 DIAGNOSIS — M545 Low back pain: Secondary | ICD-10-CM | POA: Insufficient documentation

## 2015-01-17 DIAGNOSIS — R269 Unspecified abnormalities of gait and mobility: Secondary | ICD-10-CM | POA: Diagnosis not present

## 2015-01-17 DIAGNOSIS — Z7409 Other reduced mobility: Secondary | ICD-10-CM | POA: Insufficient documentation

## 2015-01-17 DIAGNOSIS — Z89511 Acquired absence of right leg below knee: Secondary | ICD-10-CM | POA: Insufficient documentation

## 2015-01-17 DIAGNOSIS — R2689 Other abnormalities of gait and mobility: Secondary | ICD-10-CM

## 2015-01-17 NOTE — Therapy (Signed)
[facility] [address] [place], [place], [address] Phone: [phone]   Fax:  [phone]  Physical Therapy Treatment  Patient Details  Name: Marc Taylor MRN: [id number] Date of Birth: Jul 31, 1961 Referring Provider: Aldean Baker, MD Encounter Date: 01/17/2015      PT End of Session - 01/17/15 0930    Visit Number 2   Number of Visits 18   Date for PT Re-Evaluation 03/14/15   PT Start Time 0930   PT Stop Time 1030   PT Time Calculation (min) 60 min   Activity Tolerance Patient tolerated treatment well   Behavior During Therapy [facility] for tasks assessed/performed      Past Medical History  Diagnosis Date  . Diabetes mellitus without complication   . Hypertension   . Type II diabetes mellitus     "just dx'd" (08/24/2013)  . History of blood transfusion 1963    "one; I had yellow jaundice" (08/24/2013)  . Compartment syndrome of right foot 09/04/2014  . Fracture of fifth toe, right, closed 09/04/2014  . Constipation     Past Surgical History  Procedure Laterality Date  . Fasciotomy Right 09/04/2014    Procedure: Right Foot FASCIOTOMY and wound vac placement;  Surgeon: [name], MD;  Location: [facility] OR;  Service: Orthopedics;  Laterality: Right;  . Open reduction internal fixation (orif) foot lisfranc fracture Right 09/04/2014    Procedure: OPEN REDUCTION INTERNAL FIXATION (ORIF) FOOT LISFRANC FRACTURE;  Surgeon: [name], MD;  Location: [facility] OR;  Service: Orthopedics;  Laterality: Right;  . I&d extremity Right 09/06/2014    Procedure: Irrigation and Debridement Right Foot, TheraSkin graft and wound vac change out;  Surgeon: [name], MD;  Location: [facility] OR;  Service: Orthopedics;  Laterality: Right;  . Amputation Right 09/18/2014    transmetatarsal         Dr [name]  . Amputation Right 09/18/2014    Procedure: Right Midfoot Amputation;  Surgeon: [name], MD;  Location: [facility] OR;  Service:  Orthopedics;  Laterality: Right;  . Amputation Right 09/19/2014    Procedure: AMPUTATION BELOW KNEE RIGHT;  Surgeon: [name], MD;  Location: [facility] OR;  Service: Orthopedics;  Laterality: Right;  . Stump revision Right 11/01/2014    Procedure: Revision Below Knee Amputation;  Surgeon: [name], MD;  Location: [facility] OR;  Service: Orthopedics;  Laterality: Right;    There were no vitals taken for this visit.  Visit Diagnosis:  Abnormality of gait  Balance problems  Impaired functional mobility and activity tolerance  Right low back pain, with sciatica presence unspecified  Status post below knee amputation of right lower extremity      Subjective Assessment - 01/17/15 0937    Symptoms He reports wore prosthesis 2 hours 2x yesterday without issues.   Currently in Pain? No/denies    Prosthetic Training No skin issues noted. Patient donned prosthesis correctly PT demo 3 point gait with crutches with step thru pattern with wt shift over flat foot prosthesis with knee extension.  Sit to stand with focus on using both legs to assist. Patient ambulated 200' X 2 with crutches / prosthesis with carry-over of above at slow pace to focus. PT demo / instructed in technique to negotiate stairs, curbs & ramps. Patient negotiated 4 steps with 2 rails, right rail / left crutch, right ascending rail with opposite crutch, left ascending rail & opposite crutch. Patient negotiated ramp & curb with crutches & prosthesis with  supervision.  Therapeutic Exercise: NuStep Level 5 with bil. UE & bil. LE X 14 minutes.            [facility] Treatment/Exercise - 01/16/15 1400    Prosthetics   Education Provided Skin check;Residual limb care;Prosthetic cleaning;Ply sock cleaning;Correct ply sock adjustment;Proper wear schedule/adjustment;Proper weight-bearing schedule/adjustment  donning prosthesis, donning, wear 2 hours 2x/day   Person(s) Educated Patient   Education Method  Explanation;Demonstration   Education Method Verbalized understanding;Returned demonstration;Needs further instruction   Donning Prosthesis Supervision   Doffing Prosthesis Supervision                PT Education - 01/17/15 0930    Education provided Yes   Education Details gait including barriers, seated cardio only with light resistance at steady pace to work initally to increase time   Starwood HotelsPerson(s) Educated Patient   Methods Explanation;Demonstration   Comprehension Verbalized understanding;Returned demonstration;Need further instruction          PT Short Term Goals - 01/16/15 1656    PT SHORT TERM GOAL #1   Title Patient tolerates >8hours /day wear without skin issues or limb pain. (Target Date: 02/14/15)   Time 30   Period Days   Status New   PT SHORT TERM GOAL #2   Title Patient verbalizes proper prosthetic care except cues to adjust ply socks. (Target Date: 02/14/15)   Time 30   Period Days   Status New   PT SHORT TERM GOAL #3   Title Patient ambulates 400' with LRAD / prosthesis with cues only. (Target Date: 02/14/15)   Time 30   Period Days   Status New   PT SHORT TERM GOAL #4   Title Patient negotiates ramp & curb with LRAD /prosthesis with cues only. (Target Date: 02/14/15)   PT SHORT TERM GOAL #5   Title [name] Balance >36/56 (Target Date: 02/14/15)   Time 30   Period Days   Status New           PT Long Term Goals - 01/16/15 1707    PT LONG TERM GOAL #1   Title Patient is independent with prosthetic care. (Target Date: 03/14/15)   Time 60   Period Days   Status New   PT LONG TERM GOAL #2   Title Patient tolerates wear >90% of awake hours without skin issues or limb pain. (Target Date: 03/14/15)   Time 60   Period Days   Status New   PT LONG TERM GOAL #3   Title patient reports no back pain with activities. (Target Date: 03/14/15)   Time 60   Period Days   Status New   PT LONG TERM GOAL #4   Title Patient ambulates >1000' including grass, ramps, curbs,  stairs with prosthesis only independently. (Target Date: 03/14/15)   Time 60   Period Days   Status New   PT LONG TERM GOAL #5   Title Patient lifts & carries 30#, climbs ladders, push / pull carts with prosthesis safely. (Target Date: 03/14/15)   Time 60   Period Days   Status New               Plan - 01/17/15 0930    Clinical Impression Statement Patient appears to understand basics of negotiating stairs with at least 1 rail, ramps & curbs modified technique with crutches. He still has limited ability to tolerate weight on prosthesis   Pt will benefit from skilled therapeutic intervention in order to improve on the  following deficits Abnormal gait;Decreased activity tolerance;Decreased balance;Decreased endurance;Decreased knowledge of use of DME;Decreased mobility;Decreased strength;Pain  prosthetic dependency   Rehab Potential Good   PT Frequency 2x / week   PT Duration Other (comment)  9 weeks    PT Treatment/Interventions ADLs/Self Care Home Management;DME Instruction;Gait training;Stair training;Functional mobility training;Therapeutic activities;Therapeutic exercise;Balance training;Neuromuscular re-education;Patient/family education;Other (comment)  prsothetic training   PT Next Visit Plan review prosthetic care, gait with crutches on barriers   PT Home Exercise Plan mid-line HEP   Consulted and Agree with Plan of Care Patient        Problem List Patient Active Problem List   Diagnosis Date Noted  . Leg wound, right 10/23/2014  . Gas gangrene of foot 09/18/2014  . Compartment syndrome of right foot 09/04/2014  . Fracture of fifth toe, right, closed 09/04/2014  . Compartment syndrome of foot 09/04/2014  . Diabetes type 2, uncontrolled 08/24/2013  . HTN (hypertension) 08/24/2013  . Hyperglycemia 08/24/2013    Stana Bunting, DPT Physical Therapist Specializing in Prosthetics 01/17/2015, 4:47 PM  [place] [address] [place], [place], [address] Phone: [phone]   Fax:  [phone]

## 2015-01-20 ENCOUNTER — Ambulatory Visit: Payer: Worker's Compensation | Attending: Orthopedic Surgery | Admitting: Physical Therapy

## 2015-01-20 DIAGNOSIS — R29818 Other symptoms and signs involving the nervous system: Secondary | ICD-10-CM | POA: Diagnosis not present

## 2015-01-20 DIAGNOSIS — Z7409 Other reduced mobility: Secondary | ICD-10-CM | POA: Diagnosis not present

## 2015-01-20 DIAGNOSIS — Z89511 Acquired absence of right leg below knee: Secondary | ICD-10-CM | POA: Diagnosis not present

## 2015-01-20 DIAGNOSIS — R269 Unspecified abnormalities of gait and mobility: Secondary | ICD-10-CM | POA: Diagnosis not present

## 2015-01-20 DIAGNOSIS — R2689 Other abnormalities of gait and mobility: Secondary | ICD-10-CM

## 2015-01-20 NOTE — Therapy (Signed)
[facility] [address] [place], [place], [address] Phone: [phone]   Fax:  [phone]  Physical Therapy Treatment  Patient Details  Name: Marc Taylor MRN: [id number] Date of Birth: Dec 06, 1960 Referring Provider:  Blair Heys, MD  Encounter Date: 01/20/2015      PT End of Session - 01/20/15 1645    Visit Number 3   Number of Visits 18   Date for PT Re-Evaluation 03/14/15   PT Start Time 1400   PT Stop Time 1450   PT Time Calculation (min) 50 min   Equipment Utilized During Treatment Gait belt   Activity Tolerance Patient tolerated treatment well   Behavior During Therapy [facility] for tasks assessed/performed      Past Medical History  Diagnosis Date  . Diabetes mellitus without complication   . Hypertension   . Type II diabetes mellitus     "just dx'd" (08/24/2013)  . History of blood transfusion 1963    "one; I had yellow jaundice" (08/24/2013)  . Compartment syndrome of right foot 09/04/2014  . Fracture of fifth toe, right, closed 09/04/2014  . Constipation     Past Surgical History  Procedure Laterality Date  . Fasciotomy Right 09/04/2014    Procedure: Right Foot FASCIOTOMY and wound vac placement;  Surgeon: [name], MD;  Location: [facility] OR;  Service: Orthopedics;  Laterality: Right;  . Open reduction internal fixation (orif) foot lisfranc fracture Right 09/04/2014    Procedure: OPEN REDUCTION INTERNAL FIXATION (ORIF) FOOT LISFRANC FRACTURE;  Surgeon: [name], MD;  Location: [facility] OR;  Service: Orthopedics;  Laterality: Right;  . I&d extremity Right 09/06/2014    Procedure: Irrigation and Debridement Right Foot, TheraSkin graft and wound vac change out;  Surgeon: [name], MD;  Location: [facility] OR;  Service: Orthopedics;  Laterality: Right;  . Amputation Right 09/18/2014    transmetatarsal         Dr [name]  . Amputation Right 09/18/2014    Procedure: Right Midfoot Amputation;  Surgeon:  [name], MD;  Location: [facility] OR;  Service: Orthopedics;  Laterality: Right;  . Amputation Right 09/19/2014    Procedure: AMPUTATION BELOW KNEE RIGHT;  Surgeon: [name], MD;  Location: [facility] OR;  Service: Orthopedics;  Laterality: Right;  . Stump revision Right 11/01/2014    Procedure: Revision Below Knee Amputation;  Surgeon: [name], MD;  Location: [facility] OR;  Service: Orthopedics;  Laterality: Right;    There were no vitals taken for this visit.  Visit Diagnosis:  Abnormality of gait  Balance problems  Impaired functional mobility and activity tolerance  Status post below knee amputation of right lower extremity      Subjective Assessment - 01/20/15 1532    Symptoms Wore prosthesis 2 hours 2x/day over the weekend. Bone is still sore but not any more than previously.   Currently in Pain? Yes   Pain Score 5      Prosthetic Training: See patient education. No skin changes. PT instructed to use Secret deodorant on limb to decrease sweat. Patient ambulated 200' X 2 with crutches with PWB on prosthesis but improved stance duration. Patient negotiated stairs with 1 rail / 1 crutch, curb with 2 crutches, ramp with 2 crutches with SBA, minor cues on technique but modifying sequence to limit WB on prosthesis.  Neuromuscular Re-ed:  Standing in front of mirror with feet even off line on floor. Manual cues to weight shift to mid-line, cues on proprioception  with socket interface. Weight shift right, midline, left with cues on proprioception at each point Red theraband: alternate UE and BUE - rows, forward reach, and upward reach.  Therapeutic Exercise: NuStep Level 3 with 4 extremities for 5 minutes. PT instructed in exercise machine at [facility], set-up.                       PT Education - 01/20/15 1533    Education provided Yes   Education Details sweat signs and need to dry limb /liner, adjusting ply socks, standing mid-line for stationery activities    Person(s) Educated Patient;Other (comment)  friend   Methods Explanation;Demonstration   Comprehension Verbalized understanding;Returned demonstration;Need further instruction          PT Short Term Goals - 01/16/15 1656    PT SHORT TERM GOAL #1   Title Patient tolerates >8hours /day wear without skin issues or limb pain. (Target Date: 02/14/15)   Time 30   Period Days   Status New   PT SHORT TERM GOAL #2   Title Patient verbalizes proper prosthetic care except cues to adjust ply socks. (Target Date: 02/14/15)   Time 30   Period Days   Status New   PT SHORT TERM GOAL #3   Title Patient ambulates 400' with LRAD / prosthesis with cues only. (Target Date: 02/14/15)   Time 30   Period Days   Status New   PT SHORT TERM GOAL #4   Title Patient negotiates ramp & curb with LRAD /prosthesis with cues only. (Target Date: 02/14/15)   PT SHORT TERM GOAL #5   Title [name] Balance >36/56 (Target Date: 02/14/15)   Time 30   Period Days   Status New           PT Long Term Goals - 01/16/15 1707    PT LONG TERM GOAL #1   Title Patient is independent with prosthetic care. (Target Date: 03/14/15)   Time 60   Period Days   Status New   PT LONG TERM GOAL #2   Title Patient tolerates wear >90% of awake hours without skin issues or limb pain. (Target Date: 03/14/15)   Time 60   Period Days   Status New   PT LONG TERM GOAL #3   Title patient reports no back pain with activities. (Target Date: 03/14/15)   Time 60   Period Days   Status New   PT LONG TERM GOAL #4   Title Patient ambulates >1000' including grass, ramps, curbs, stairs with prosthesis only independently. (Target Date: 03/14/15)   Time 60   Period Days   Status New   PT LONG TERM GOAL #5   Title Patient lifts & carries 30#, climbs ladders, push / pull carts with prosthesis safely. (Target Date: 03/14/15)   Time 60   Period Days   Status New               Plan - 01/20/15 1645    Clinical Impression Statement Patient is  tolerating more weight on limb with less pain / tenderness. patient is ready to increase wear.    Pt will benefit from skilled therapeutic intervention in order to improve on the following deficits Abnormal gait;Decreased activity tolerance;Decreased balance;Decreased endurance;Decreased knowledge of use of DME;Decreased mobility;Decreased strength;Pain  prosthetic dependency   Rehab Potential Good   PT Frequency 2x / week   PT Duration Other (comment)  9 weeks    PT Treatment/Interventions ADLs/Self Care Home Management;DME  Instruction;Gait training;Stair training;Functional mobility training;Therapeutic activities;Therapeutic exercise;Balance training;Neuromuscular re-education;Patient/family education;Other (comment)  prsothetic training   PT Next Visit Plan review prosthetic care, gait with crutches on barriers / outside, balance   PT Home Exercise Plan mid-line HEP   Consulted and Agree with Plan of Care Patient        Problem List Patient Active Problem List   Diagnosis Date Noted  . Leg wound, right 10/23/2014  . Gas gangrene of foot 09/18/2014  . Compartment syndrome of right foot 09/04/2014  . Fracture of fifth toe, right, closed 09/04/2014  . Compartment syndrome of foot 09/04/2014  . Diabetes type 2, uncontrolled 08/24/2013  . HTN (hypertension) 08/24/2013  . Hyperglycemia 08/24/2013    Adrien Shankar PT, DPT 01/20/2015, 4:48 PM  [facility] [address] [place], [place], [address] Phone: [phone]   Fax:  [phone]

## 2015-01-23 ENCOUNTER — Ambulatory Visit: Payer: Worker's Compensation | Attending: Orthopedic Surgery | Admitting: Physical Therapy

## 2015-01-23 ENCOUNTER — Encounter: Payer: Self-pay | Admitting: Physical Therapy

## 2015-01-23 DIAGNOSIS — Z7409 Other reduced mobility: Secondary | ICD-10-CM | POA: Insufficient documentation

## 2015-01-23 DIAGNOSIS — R29818 Other symptoms and signs involving the nervous system: Secondary | ICD-10-CM | POA: Insufficient documentation

## 2015-01-23 DIAGNOSIS — R269 Unspecified abnormalities of gait and mobility: Secondary | ICD-10-CM

## 2015-01-23 DIAGNOSIS — R2689 Other abnormalities of gait and mobility: Secondary | ICD-10-CM

## 2015-01-23 DIAGNOSIS — Z89511 Acquired absence of right leg below knee: Secondary | ICD-10-CM | POA: Insufficient documentation

## 2015-01-23 NOTE — Therapy (Signed)
[facility] [address] [place], [place], [address] Phone: [phone]   Fax:  [phone]  Physical Therapy Treatment  Patient Details  Name: Marc Taylor MRN: [id number] Date of Birth: [birth year] Referring Provider:  Blair Heys, MD  Encounter Date: 01/23/2015      PT End of Session - 01/23/15 1400    Visit Number 4   Number of Visits 18   Date for PT Re-Evaluation 03/14/15   PT Start Time 1315   PT Stop Time 1400   PT Time Calculation (min) 45 min   Equipment Utilized During Treatment Gait belt   Activity Tolerance Patient tolerated treatment well   Behavior During Therapy [facility] for tasks assessed/performed      Past Medical History  Diagnosis Date  . Diabetes mellitus without complication   . Hypertension   . Type II diabetes mellitus     "just dx'd" (08/24/2013)  . History of blood transfusion 1963    "one; I had yellow jaundice" (08/24/2013)  . Compartment syndrome of right foot 09/04/2014  . Fracture of fifth toe, right, closed 09/04/2014  . Constipation     Past Surgical History  Procedure Laterality Date  . Fasciotomy Right 09/04/2014    Procedure: Right Foot FASCIOTOMY and wound vac placement;  Surgeon: [name], MD;  Location: [facility] OR;  Service: Orthopedics;  Laterality: Right;  . Open reduction internal fixation (orif) foot lisfranc fracture Right 09/04/2014    Procedure: OPEN REDUCTION INTERNAL FIXATION (ORIF) FOOT LISFRANC FRACTURE;  Surgeon: [name], MD;  Location: [facility] OR;  Service: Orthopedics;  Laterality: Right;  . I&d extremity Right 09/06/2014    Procedure: Irrigation and Debridement Right Foot, TheraSkin graft and wound vac change out;  Surgeon: [name], MD;  Location: [facility] OR;  Service: Orthopedics;  Laterality: Right;  . Amputation Right 09/18/2014    transmetatarsal         Dr [name]  . Amputation Right 09/18/2014    Procedure: Right Midfoot Amputation;  Surgeon:  [name], MD;  Location: [facility] OR;  Service: Orthopedics;  Laterality: Right;  . Amputation Right 09/19/2014    Procedure: AMPUTATION BELOW KNEE RIGHT;  Surgeon: [name], MD;  Location: [facility] OR;  Service: Orthopedics;  Laterality: Right;  . Stump revision Right 11/01/2014    Procedure: Revision Below Knee Amputation;  Surgeon: [name], MD;  Location: [facility] OR;  Service: Orthopedics;  Laterality: Right;    There were no vitals filed for this visit.  Visit Diagnosis:  Abnormality of gait  Balance problems  Impaired functional mobility and activity tolerance  Status post below knee amputation of right lower extremity      Subjective Assessment - 01/23/15 1323    Symptoms wearing prosthesis 3 hours 2x/day this is 3rd day.    Currently in Pain? No/denies     Prosthetic Training: No skin changes. Patient using deodorant as advised but still sweating heavily. See patient education on donning. PT instructed in 4 point gait: Patient ambulated 59' with 4 point pattern but requires max cues and patient did not want to continue.So PT switched back to 3 point pattern with cues on step length.  Pt neg stairs with 1 rail & 1 crutch with SBA / min cues. Patient neg ramp & curb with crutches with SBA / cues. At end of session, patient ambulated 64' with crutches 3 point pattern with improved foot flat in stance.  Neuromuscular Re-ed: Parallel bars with  mirror for visual feedback, with tactile & verbal cues: Standing with equal wt: green theraband reciprocal & BUE - row, upward reach and forward reach. Red Theraband left stepping abd, add, ext, flex with cues on foot flat on prosthesis. Stepping onto 8" stool with left LE while PT tapping prosthetic foot for feedback when bearing wt thru flat foot. Knee control with green theraband with feet even, prosthesis forward and prosthesis back. 4" stool stepping with BUE support forward 10X and side stepping  10X.                          PT Education - 01/23/15 1400    Education provided Yes   Education Details If no pain or issues, increase wear to 4 hours 2x/day on 3/13. Donning with pin oriented to pull tissue forward towards tiba but no friction on distal limb. Standing mid-line with equal wt on feet.   Person(s) Educated Patient   Methods Explanation;Demonstration   Comprehension Verbalized understanding;Returned demonstration;Need further instruction          PT Short Term Goals - 01/16/15 1656    PT SHORT TERM GOAL #1   Title Patient tolerates >8hours /day wear without skin issues or limb pain. (Target Date: 02/14/15)   Time 30   Period Days   Status New   PT SHORT TERM GOAL #2   Title Patient verbalizes proper prosthetic care except cues to adjust ply socks. (Target Date: 02/14/15)   Time 30   Period Days   Status New   PT SHORT TERM GOAL #3   Title Patient ambulates 400' with LRAD / prosthesis with cues only. (Target Date: 02/14/15)   Time 30   Period Days   Status New   PT SHORT TERM GOAL #4   Title Patient negotiates ramp & curb with LRAD /prosthesis with cues only. (Target Date: 02/14/15)   PT SHORT TERM GOAL #5   Title [name] Balance >36/56 (Target Date: 02/14/15)   Time 30   Period Days   Status New           PT Long Term Goals - 01/16/15 1707    PT LONG TERM GOAL #1   Title Patient is independent with prosthetic care. (Target Date: 03/14/15)   Time 60   Period Days   Status New   PT LONG TERM GOAL #2   Title Patient tolerates wear >90% of awake hours without skin issues or limb pain. (Target Date: 03/14/15)   Time 60   Period Days   Status New   PT LONG TERM GOAL #3   Title patient reports no back pain with activities. (Target Date: 03/14/15)   Time 60   Period Days   Status New   PT LONG TERM GOAL #4   Title Patient ambulates >1000' including grass, ramps, curbs, stairs with prosthesis only independently. (Target Date: 03/14/15)   Time 60    Period Days   Status New   PT LONG TERM GOAL #5   Title Patient lifts & carries 30#, climbs ladders, push / pull carts with prosthesis safely. (Target Date: 03/14/15)   Time 60   Period Days   Status New               Plan - 01/23/15 1400    Clinical Impression Statement Patient tolerated more weight on prosthesis with stationery activites. After proprioceptive techniques patient ambulated at end of session with flat foot in stance.  Pt will benefit from skilled therapeutic intervention in order to improve on the following deficits Abnormal gait;Decreased activity tolerance;Decreased balance;Decreased endurance;Decreased knowledge of use of DME;Decreased mobility;Decreased strength;Pain  prosthetic dependency   Rehab Potential Good   PT Frequency 2x / week   PT Duration Other (comment)  9 weeks    PT Treatment/Interventions ADLs/Self Care Home Management;DME Instruction;Gait training;Stair training;Functional mobility training;Therapeutic activities;Therapeutic exercise;Balance training;Neuromuscular re-education;Patient/family education;Other (comment)  prsothetic training   PT Next Visit Plan review prosthetic care, gait with crutches on barriers / outside, balance   PT Home Exercise Plan mid-line HEP   Consulted and Agree with Plan of Care Patient        Problem List Patient Active Problem List   Diagnosis Date Noted  . Leg wound, right 10/23/2014  . Gas gangrene of foot 09/18/2014  . Compartment syndrome of right foot 09/04/2014  . Fracture of fifth toe, right, closed 09/04/2014  . Compartment syndrome of foot 09/04/2014  . Diabetes type 2, uncontrolled 08/24/2013  . HTN (hypertension) 08/24/2013  . Hyperglycemia 08/24/2013    Eriana Suliman PT, DPT 01/23/2015, 9:09 PM  [facility] [address] [place], [place], [address] Phone: [phone]   Fax:  [phone]

## 2015-01-29 ENCOUNTER — Ambulatory Visit: Payer: Worker's Compensation | Attending: Orthopedic Surgery | Admitting: Physical Therapy

## 2015-01-29 ENCOUNTER — Encounter: Payer: Self-pay | Admitting: Physical Therapy

## 2015-01-29 DIAGNOSIS — Z7409 Other reduced mobility: Secondary | ICD-10-CM | POA: Insufficient documentation

## 2015-01-29 DIAGNOSIS — Z89511 Acquired absence of right leg below knee: Secondary | ICD-10-CM | POA: Diagnosis not present

## 2015-01-29 DIAGNOSIS — R29818 Other symptoms and signs involving the nervous system: Secondary | ICD-10-CM | POA: Insufficient documentation

## 2015-01-29 DIAGNOSIS — R269 Unspecified abnormalities of gait and mobility: Secondary | ICD-10-CM | POA: Diagnosis present

## 2015-01-29 DIAGNOSIS — R2689 Other abnormalities of gait and mobility: Secondary | ICD-10-CM

## 2015-01-29 NOTE — Therapy (Signed)
[facility] [address] [place], [place], [address] Phone: [phone]   Fax:  [phone]  Physical Therapy Treatment  Patient Details  Name: Marc Taylor MRN: [id number] Date of Birth: 1961/10/27 Referring Provider:  Blair HeysEhinger, Robert, MD  Encounter Date: 01/29/2015    Past Medical History  Diagnosis Date  . Diabetes mellitus without complication   . Hypertension   . Type II diabetes mellitus     "just dx'd" (08/24/2013)  . History of blood transfusion 1963    "one; I had yellow jaundice" (08/24/2013)  . Compartment syndrome of right foot 09/04/2014  . Fracture of fifth toe, right, closed 09/04/2014  . Constipation     Past Surgical History  Procedure Laterality Date  . Fasciotomy Right 09/04/2014    Procedure: Right Foot FASCIOTOMY and wound vac placement;  Surgeon: [name], MD;  [facility];  Service: Orthopedics;  Laterality: Right;  . Open reduction internal fixation (orif) foot lisfranc fracture Right 09/04/2014    Procedure: OPEN REDUCTION INTERNAL FIXATION (ORIF) FOOT LISFRANC FRACTURE;  Surgeon: [name], MD;  Location: [facility] OR;  Service: Orthopedics;  Laterality: Right;  . I&d extremity Right 09/06/2014    Procedure: Irrigation and Debridement Right Foot, TheraSkin graft and wound vac change out;  Surgeon: [name], MD;  Location: [facility] OR;  Service: Orthopedics;  Laterality: Right;  . Amputation Right 09/18/2014    transmetatarsal         [name]  . Amputation Right 09/18/2014    Procedure: Right Midfoot Amputation;  Surgeon: [name], MD;  [facility];  Service: Orthopedics;  Laterality: Right;  . Amputation Right 09/19/2014    Procedure: AMPUTATION BELOW KNEE RIGHT;  Surgeon: [name], MD;  Location: [facility] OR;  Service: Orthopedics;  Laterality: Right;  . Stump revision Right 11/01/2014    Procedure: Revision Below Knee Amputation;  Surgeon: [name], MD;  [facility];  Service: Orthopedics;  Laterality: Right;    There were no vitals filed for this visit.  Visit Diagnosis:  Abnormality of gait  Balance problems  Impaired functional mobility and activity tolerance  Status post below knee amputation of right lower extremity      Subjective Assessment - 01/29/15 0856    Symptoms wearing prosthesis 4 hours 2x/day since Sunday without issues. Prosthetist made ankle more mobile and it feels better.    Currently in Pain? Yes   Pain Score 4    Pain Location Leg   Pain Onset More than a month ago                       [facility] Adult PT Treatment/Exercise - 01/29/15 0858    Transfers   Transfers Sit to Stand;Stand to Sit   Sit to Stand 5: Supervision;With upper extremity assist;With armrests;From chair/3-in-1   Stand to Sit 5: Supervision;With upper extremity assist;With armrests;To chair/3-in-1   Ambulation/Gait   Ambulation/Gait Yes   Ambulation/Gait Assistance 4: Min assist   Ambulation/Gait Assistance Details verbal cues on sequence & posture   Ambulation Distance (Feet) 300 Feet  2x   Assistive device Prosthesis;Straight cane  initially with cane hand hold assist progressed with cane on   Gait Pattern Decreased stance time - right;Decreased stride length;Decreased weight shift to right;Right hip hike;Right flexed knee in stance;Antalgic;Trunk flexed;Abducted- right;Step-through pattern  PWB on prosthesis   Ambulation Surface Level;Indoor   Gait velocity --   Stairs Yes  Stairs Assistance 5: Supervision   Stairs Assistance Details (indicate cue type and reason) PT demo /instructed technique for reciprocal   Stair Management Technique One rail Right;Step to pattern;With cane;Two rails;Alternating pattern  step-to with cane/rail, alternating with 2 rails   Number of Stairs 4  3 reps with cane/rail, 10 reps with 2 rails   Ramp 4: Min assist  single point cane, initially handhold   Ramp Details (indicate cue type and  reason) verbal & tactile cues on posture, step length and wt shift   Curb 4: Min assist  single point cane, initially with hand hold   Curb Details (indicate cue type and reason) demo, verbal & tactile cues on sequence & technique   Posture/Postural Control   Posture/Postural Control --   Postural Limitations --   Balance   Balance Assessed Yes   Dynamic Standing Balance   Dynamic Standing - Balance Support During functional activity  in parallel bars with occ. touch, tactile / minA from PT   Dynamic Standing - Level of Assistance 5: Stand by assistance;4: Min assist   Dynamic Standing - Balance Activities Head turns;Head nods;Eyes open;Eyes closed   Prosthetics   Current prosthetic wear tolerance (days/week)  7 days/wk   Current prosthetic wear tolerance (#hours/day)  4 hrs 2x/day   Current prosthetic weight-bearing tolerance (hours/day)  30 min   Residual limb condition  no open areas, tender at lateral incision, normal color, temperature & moisture levels.   Education Provided Correct ply sock adjustment  donning pin orientation   Person(s) Educated Patient   Education Method Explanation;Demonstration   Education Method Verbalized understanding   Donning Prosthesis Supervision   Doffing Prosthesis Modified independent (device/increased time)                PT Education - 01/29/15 1042    Education provided Yes   Education Details 3 systems used for balance   Person(s) Educated Patient   Methods Explanation;Demonstration   Comprehension Verbalized understanding          PT Short Term Goals - 01/16/15 1656    PT SHORT TERM GOAL #1   Title Patient tolerates >8hours /day wear without skin issues or limb pain. (Target Date: 02/14/15)   Time 30   Period Days   Status New   PT SHORT TERM GOAL #2   Title Patient verbalizes proper prosthetic care except cues to adjust ply socks. (Target Date: 02/14/15)   Time 30   Period Days   Status New   PT SHORT TERM GOAL #3    Title Patient ambulates 400' with LRAD / prosthesis with cues only. (Target Date: 02/14/15)   Time 30   Period Days   Status New   PT SHORT TERM GOAL #4   Title Patient negotiates ramp & curb with LRAD /prosthesis with cues only. (Target Date: 02/14/15)   PT SHORT TERM GOAL #5   Title [name] Balance >36/56 (Target Date: 02/14/15)   Time 30   Period Days   Status New           PT Long Term Goals - 01/16/15 1707    PT LONG TERM GOAL #1   Title Patient is independent with prosthetic care. (Target Date: 03/14/15)   Time 60   Period Days   Status New   PT LONG TERM GOAL #2   Title Patient tolerates wear >90% of awake hours without skin issues or limb pain. (Target Date: 03/14/15)   Time 60   Period Days  Status New   PT LONG TERM GOAL #3   Title patient reports no back pain with activities. (Target Date: 03/14/15)   Time 60   Period Days   Status New   PT LONG TERM GOAL #4   Title Patient ambulates >1000' including grass, ramps, curbs, stairs with prosthesis only independently. (Target Date: 03/14/15)   Time 60   Period Days   Status New   PT LONG TERM GOAL #5   Title Patient lifts & carries 30#, climbs ladders, push / pull carts with prosthesis safely. (Target Date: 03/14/15)   Time 60   Period Days   Status New               Plan - 01/29/15 1042    Clinical Impression Statement patient was able to tolerate more weight / pressure thru prosthesis without pain with gait with cane and reciprocal on stairs.    Pt will benefit from skilled therapeutic intervention in order to improve on the following deficits Abnormal gait;Decreased activity tolerance;Decreased balance;Decreased endurance;Decreased knowledge of use of DME;Decreased mobility;Decreased strength;Pain  prosthetic dependency   Rehab Potential Good   PT Frequency 2x / week   PT Duration Other (comment)  9 weeks    PT Treatment/Interventions ADLs/Self Care Home Management;DME Instruction;Gait training;Stair  training;Functional mobility training;Therapeutic activities;Therapeutic exercise;Balance training;Neuromuscular re-education;Patient/family education;Other (comment)  prsothetic training   PT Next Visit Plan review prosthetic care, gait with crutches on barriers / outside, balance   Consulted and Agree with Plan of Care Patient        Problem List Patient Active Problem List   Diagnosis Date Noted  . Leg wound, right 10/23/2014  . Gas gangrene of foot 09/18/2014  . Compartment syndrome of right foot 09/04/2014  . Fracture of fifth toe, right, closed 09/04/2014  . Compartment syndrome of foot 09/04/2014  . Diabetes type 2, uncontrolled 08/24/2013  . HTN (hypertension) 08/24/2013  . Hyperglycemia 08/24/2013    Lilyonna Steidle PT, DPT 01/29/2015, 10:46 AM  [facility] [address] [place], [place], [address] Phone: [phone]   Fax:  [phone]

## 2015-01-31 ENCOUNTER — Ambulatory Visit: Payer: Worker's Compensation | Admitting: Physical Therapy

## 2015-01-31 ENCOUNTER — Encounter: Payer: Self-pay | Admitting: Physical Therapy

## 2015-01-31 DIAGNOSIS — Z7409 Other reduced mobility: Secondary | ICD-10-CM

## 2015-01-31 DIAGNOSIS — R269 Unspecified abnormalities of gait and mobility: Secondary | ICD-10-CM

## 2015-01-31 NOTE — Therapy (Signed)
[facility] [address] [place], [place], [address] Phone: [phone]   Fax:  [phone]  Physical Therapy Treatment  Patient Details  Name: Marc Taylor MRN: [id number] Date of Birth: 1961-04-24 Referring Provider:  [name], MD  Encounter Date: 01/31/2015      PT End of Session - 01/31/15 1241    Visit Number 5   Number of Visits 18   Date for PT Re-Evaluation 03/14/15   PT Start Time 1235   PT Stop Time 1318   PT Time Calculation (min) 43 min   Equipment Utilized During Treatment Gait belt   Activity Tolerance Patient tolerated treatment well   Behavior During Therapy [facility] for tasks assessed/performed      Past Medical History  Diagnosis Date  . Diabetes mellitus without complication   . Hypertension   . Type II diabetes mellitus     "just dx'd" (08/24/2013)  . History of blood transfusion 1963    "one; I had yellow jaundice" (08/24/2013)  . Compartment syndrome of right foot 09/04/2014  . Fracture of fifth toe, right, closed 09/04/2014  . Constipation     Past Surgical History  Procedure Laterality Date  . Fasciotomy Right 09/04/2014    Procedure: Right Foot FASCIOTOMY and wound vac placement;  Surgeon: [name], MD;  [facility];  Service: Orthopedics;  Laterality: Right;  . Open reduction internal fixation (orif) foot lisfranc fracture Right 09/04/2014    Procedure: OPEN REDUCTION INTERNAL FIXATION (ORIF) FOOT LISFRANC FRACTURE;  Surgeon: [name], MD;  [facility] OR;  Service: Orthopedics;  Laterality: Right;  . I&d extremity Right 09/06/2014    Procedure: Irrigation and Debridement Right Foot, TheraSkin graft and wound vac change out;  Surgeon: [name], MD;  Location: [facility] OR;  Service: Orthopedics;  Laterality: Right;  . Amputation Right 09/18/2014    transmetatarsal         Dr [name]  . Amputation Right 09/18/2014    Procedure: Right Midfoot Amputation;  Surgeon:  [name], MD;  Location[facility];  Service: Orthopedics;  Laterality: Right;  . Amputation Right 09/19/2014    Procedure: AMPUTATION BELOW KNEE RIGHT;  Surgeon: [name], MD;  Location: [facility] OR;  Service: Orthopedics;  Laterality: Right;  . Stump revision Right 11/01/2014    Procedure: Revision Below Knee Amputation;  Surgeon: [name], MD;  Location: [facility] OR;  Service: Orthopedics;  Laterality: Right;    There were no vitals filed for this visit.  Visit Diagnosis:  Abnormality of gait  Impaired functional mobility and activity tolerance      Subjective Assessment - 01/31/15 1238    Symptoms No new complaints. No falls.  Some pain at distal end of limb.   Currently in Pain? Yes   Pain Score 5    Pain Location Leg   Pain Orientation Lower   Pain Descriptors / Indicators Sore;Throbbing   Pain Onset More than a month ago   Pain Frequency Occasional   Aggravating Factors  increased prosthetic wear   Pain Relieving Factors resting            [facility] Adult PT Treatment/Exercise - 01/31/15 1242    Ambulation/Gait   Ambulation/Gait Yes   Ambulation/Gait Assistance 4: Min guard;4: Min assist   Ambulation/Gait Assistance Details cues on posture, increased step length and cane use. Increased assistance needed on uneven surfaces and with cane.  Ambulation Distance (Feet) 600 Feet  x1 outside, 120 with cane, 230 with crutches   Assistive device Prosthesis;Straight cane   Gait Pattern Decreased step length - left;Decreased stance time - right;Step-through pattern;Antalgic;Narrow base of support;Right flexed knee in stance   Ambulation Surface Level;Unlevel;Indoor;Outdoor;Paved;Grass  pine needles   Stairs Yes   Stairs Assistance 5: Supervision;4: Min guard   Stairs Assistance Details (indicate cue type and reason) 2 reps step to pattern:1. right rail/cane 2. left rail/cane. 3rd rep: rail and cane focusing on reciprocal pattern down with cues on  techique/sequence/posture                             Stair Management Technique One rail Right;One rail Left;Step to pattern;With cane   Number of Stairs 4  x3 reps   Ramp 4: Min assist  cane/prosthesis   Ramp Details (indicate cue type and reason) cues on sequence, step length, posture and cane placement   Curb 4: Min assist  crutches/prosthesis outside,cane/prosthesis inside   Knee/Hip Exercises: Aerobic   Stationary Bike scifit x 4 extremities level 3.5 x 8 minutes with goal rpm >/= 50 for strengthening and activity tolerance   Prosthetics   Prosthetic Care Comments  educated on use of baby oil above the knee for heat rash control   Current prosthetic wear tolerance (days/week)  7 days/wk   Current prosthetic wear tolerance (#hours/day)  4 hrs 2x/day  to increase to 5 hours 2 x day   Residual limb condition  intact, some heat rash above knee   Education Provided Proper wear schedule/adjustment;Residual limb care;Correct ply sock adjustment   Donning Prosthesis Supervision   Doffing Prosthesis Modified independent (device/increased time)           PT Short Term Goals - 01/16/15 1656    PT SHORT TERM GOAL #1   Title Patient tolerates >8hours /day wear without skin issues or limb pain. (Target Date: 02/14/15)   Time 30   Period Days   Status New   PT SHORT TERM GOAL #2   Title Patient verbalizes proper prosthetic care except cues to adjust ply socks. (Target Date: 02/14/15)   Time 30   Period Days   Status New   PT SHORT TERM GOAL #3   Title Patient ambulates 400' with LRAD / prosthesis with cues only. (Target Date: 02/14/15)   Time 30   Period Days   Status New   PT SHORT TERM GOAL #4   Title Patient negotiates ramp & curb with LRAD /prosthesis with cues only. (Target Date: 02/14/15)   PT SHORT TERM GOAL #5   Title [name] Balance >36/56 (Target Date: 02/14/15)   Time 30   Period Days   Status New           PT Long Term Goals - 01/16/15 1707    PT LONG TERM GOAL #1    Title Patient is independent with prosthetic care. (Target Date: 03/14/15)   Time 60   Period Days   Status New   PT LONG TERM GOAL #2   Title Patient tolerates wear >90% of awake hours without skin issues or limb pain. (Target Date: 03/14/15)   Time 60   Period Days   Status New   PT LONG TERM GOAL #3   Title patient reports no back pain with activities. (Target Date: 03/14/15)   Time 60   Period Days   Status New   PT LONG TERM GOAL #  4   Title Patient ambulates >1000' including grass, ramps, curbs, stairs with prosthesis only independently. (Target Date: 03/14/15)   Time 60   Period Days   Status New   PT LONG TERM GOAL #5   Title Patient lifts & carries 30#, climbs ladders, push / pull carts with prosthesis safely. (Target Date: 03/14/15)   Time 60   Period Days   Status New           Plan - 01/31/15 1242    Clinical Impression Statement Pt is making steady progress toward his goals with increased activity today and less assist/cues needed.   Pt will benefit from skilled therapeutic intervention in order to improve on the following deficits Abnormal gait;Decreased activity tolerance;Decreased balance;Decreased endurance;Decreased knowledge of use of DME;Decreased mobility;Decreased strength;Pain  prosthetic dependency   Rehab Potential Good   PT Frequency 2x / week   PT Duration Other (comment)  9 weeks    PT Treatment/Interventions ADLs/Self Care Home Management;DME Instruction;Gait training;Stair training;Functional mobility training;Therapeutic activities;Therapeutic exercise;Balance training;Neuromuscular re-education;Patient/family education;Other (comment)  prsothetic training   PT Next Visit Plan review prosthetic care, gait with crutches on barriers / outside, balance   Consulted and Agree with Plan of Care Patient        Problem List Patient Active Problem List   Diagnosis Date Noted  . Leg wound, right 10/23/2014  . Gas gangrene of foot 09/18/2014  .  Compartment syndrome of right foot 09/04/2014  . Fracture of fifth toe, right, closed 09/04/2014  . Compartment syndrome of foot 09/04/2014  . Diabetes type 2, uncontrolled 08/24/2013  . HTN (hypertension) 08/24/2013  . Hyperglycemia 08/24/2013    Sallyanne KusterBury, Cynde Menard 01/31/2015, 6:30 PM  Sallyanne KusterKathy Perkins Molina, PTA, [facility] [address], [address] [place], [place] [address] [phone] 01/31/2015, 6:31 PM

## 2015-02-04 ENCOUNTER — Encounter: Payer: Self-pay | Admitting: Physical Therapy

## 2015-02-04 ENCOUNTER — Ambulatory Visit: Payer: Worker's Compensation | Admitting: Physical Therapy

## 2015-02-04 DIAGNOSIS — R269 Unspecified abnormalities of gait and mobility: Secondary | ICD-10-CM | POA: Diagnosis not present

## 2015-02-04 DIAGNOSIS — R2689 Other abnormalities of gait and mobility: Secondary | ICD-10-CM

## 2015-02-04 DIAGNOSIS — Z7409 Other reduced mobility: Secondary | ICD-10-CM

## 2015-02-04 NOTE — Therapy (Signed)
[facility] [address] [place], [place], [address] Phone: [phone]   Fax:  [phone]  Physical Therapy Treatment  Patient Details  Name: Marc Taylor MRN: [id number] Date of Birth: 1961/01/13 Referring Provider:  Blair Heys, MD  Encounter Date: 02/04/2015      PT End of Session - 02/04/15 1413    Visit Number 6   Number of Visits 18   Date for PT Re-Evaluation 03/14/15   PT Start Time 1410   PT Stop Time 1448   PT Time Calculation (min) 38 min   Equipment Utilized During Treatment Gait belt   Activity Tolerance Patient tolerated treatment well   Behavior During Therapy [facility] for tasks assessed/performed      Past Medical History  Diagnosis Date  . Diabetes mellitus without complication   . Hypertension   . Type II diabetes mellitus     "just dx'd" (08/24/2013)  . History of blood transfusion 1963    "one; I had yellow jaundice" (08/24/2013)  . Compartment syndrome of right foot 09/04/2014  . Fracture of fifth toe, right, closed 09/04/2014  . Constipation     Past Surgical History  Procedure Laterality Date  . Fasciotomy Right 09/04/2014    Procedure: Right Foot FASCIOTOMY and wound vac placement;  Surgeon: [name], MD;  Location: [facility] OR;  Service: Orthopedics;  Laterality: Right;  . Open reduction internal fixation (orif) foot lisfranc fracture Right 09/04/2014    Procedure: OPEN REDUCTION INTERNAL FIXATION (ORIF) FOOT LISFRANC FRACTURE;  Surgeon: [name], MD;  Location: [facility] OR;  Service: Orthopedics;  Laterality: Right;  . I&d extremity Right 09/06/2014    Procedure: Irrigation and Debridement Right Foot, TheraSkin graft and wound vac change out;  Surgeon: [name], MD;  Location: [facility] OR;  Service: Orthopedics;  Laterality: Right;  . Amputation Right 09/18/2014    transmetatarsal         Dr [name]  . Amputation Right 09/18/2014    Procedure: Right Midfoot Amputation;  Surgeon:  [name], MD;  Location: [facility] OR;  Service: Orthopedics;  Laterality: Right;  . Amputation Right 09/19/2014    Procedure: AMPUTATION BELOW KNEE RIGHT;  Surgeon: [name], MD;  Location: [facility] OR;  Service: Orthopedics;  Laterality: Right;  . Stump revision Right 11/01/2014    Procedure: Revision Below Knee Amputation;  Surgeon: [name], MD;  Location: [facility] OR;  Service: Orthopedics;  Laterality: Right;    There were no vitals filed for this visit.  Visit Diagnosis:  Abnormality of gait  Balance problems  Impaired functional mobility and activity tolerance      Subjective Assessment - 02/04/15 1411    Symptoms No new complaints. No falls.  Pain improved in limb with addition of sock ply's   Currently in Pain? No/denies           [facility] Adult PT Treatment/Exercise - 02/04/15 1413    Transfers   Sit to Stand 5: Supervision;With upper extremity assist;With armrests;From chair/3-in-1   Stand to Sit 5: Supervision;With upper extremity assist;With armrests;To chair/3-in-1   Ambulation/Gait   Ambulation/Gait Yes   Ambulation/Gait Assistance 4: Min guard;5: Supervision   Ambulation/Gait Assistance Details cues on posture, to increase stance time on right leg and step length with left leg.                         Ambulation Distance (Feet) 660 Feet  x1 indoor, >/=  500 outdoor   Assistive device Prosthesis;Straight cane   Gait Pattern Decreased stance time - right;Decreased step length - left;Antalgic   Ambulation Surface Level;Indoor;Outdoor;Paved   Stairs Yes   Stairs Assistance 5: Supervision;4: Min guard   Stairs Assistance Details (indicate cue type and reason) 1 time with right rail/cane, 1 time with left rail/cane. cues on posture and cane advancement   Stair Management Technique One rail Right;One rail Left;Forwards;With cane;Step to pattern   Number of Stairs 4  x2   Dynamic Standing Balance   Dynamic Standing - Balance Support During functional activity;Bilateral  upper extremity supported   Dynamic Standing - Level of Assistance 4: Min assist   Dynamic Standing - Personal assistantBalance Activities Rocker board;Head turns;Head nods;Eyes open;Eyes closed;Foam balance beam   Dynamic Standing - Comments both ways on rocker board: hold steady with eyes closed, hold steady with eyes open and head movements up/down, left/right and diagonals with intermittent need of UE support; standing with feet across blue foam beam: alternating forward foot taps and backwards toe taps x 10 each with UE support required.               Prosthetics   Prosthetic Care Comments  Deodorant is helping with moisture control and heat rash.                                Current prosthetic wear tolerance (days/week)  7 days/wk   Current prosthetic wear tolerance (#hours/day)  5 hours 2 x day  working his way to this.   Residual limb condition  intact per pt.   Donning Prosthesis Modified independent (device/increased time)   Doffing Prosthesis Modified independent (device/increased time)           PT Short Term Goals - 01/16/15 1656    PT SHORT TERM GOAL #1   Title Patient tolerates >8hours /day wear without skin issues or limb pain. (Target Date: 02/14/15)   Time 30   Period Days   Status New   PT SHORT TERM GOAL #2   Title Patient verbalizes proper prosthetic care except cues to adjust ply socks. (Target Date: 02/14/15)   Time 30   Period Days   Status New   PT SHORT TERM GOAL #3   Title Patient ambulates 400' with LRAD / prosthesis with cues only. (Target Date: 02/14/15)   Time 30   Period Days   Status New   PT SHORT TERM GOAL #4   Title Patient negotiates ramp & curb with LRAD /prosthesis with cues only. (Target Date: 02/14/15)   PT SHORT TERM GOAL #5   Title [name] Balance >36/56 (Target Date: 02/14/15)   Time 30   Period Days   Status New           PT Long Term Goals - 01/16/15 1707    PT LONG TERM GOAL #1   Title Patient is independent with prosthetic care. (Target Date:  03/14/15)   Time 60   Period Days   Status New   PT LONG TERM GOAL #2   Title Patient tolerates wear >90% of awake hours without skin issues or limb pain. (Target Date: 03/14/15)   Time 60   Period Days   Status New   PT LONG TERM GOAL #3   Title patient reports no back pain with activities. (Target Date: 03/14/15)   Time 60   Period Days   Status New   PT LONG  TERM GOAL #4   Title Patient ambulates >1000' including grass, ramps, curbs, stairs with prosthesis only independently. (Target Date: 03/14/15)   Time 60   Period Days   Status New   PT LONG TERM GOAL #5   Title Patient lifts & carries 30#, climbs ladders, push / pull carts with prosthesis safely. (Target Date: 03/14/15)   Time 60   Period Days   Status New           Plan - 02/04/15 1413    Clinical Impression Statement Pt progressing well towards goals. Was challenged by balance activities today.   Pt will benefit from skilled therapeutic intervention in order to improve on the following deficits Abnormal gait;Decreased activity tolerance;Decreased balance;Decreased endurance;Decreased knowledge of use of DME;Decreased mobility;Decreased strength;Pain  prosthetic dependency   Rehab Potential Good   PT Frequency 2x / week   PT Duration Other (comment)  9 weeks    PT Treatment/Interventions ADLs/Self Care Home Management;DME Instruction;Gait training;Stair training;Functional mobility training;Therapeutic activities;Therapeutic exercise;Balance training;Neuromuscular re-education;Patient/family education;Other (comment)  prsothetic training   PT Next Visit Plan Continue with gait with cane, balance and strengthening.   Consulted and Agree with Plan of Care Patient        Problem List Patient Active Problem List   Diagnosis Date Noted  . Leg wound, right 10/23/2014  . Gas gangrene of foot 09/18/2014  . Compartment syndrome of right foot 09/04/2014  . Fracture of fifth toe, right, closed 09/04/2014  . Compartment  syndrome of foot 09/04/2014  . Diabetes type 2, uncontrolled 08/24/2013  . HTN (hypertension) 08/24/2013  . Hyperglycemia 08/24/2013    Sallyanne Kuster 02/04/2015, 3:28 PM  Sallyanne Kuster, PTA, [facility] [address] [place], [place] [address] [phone] 02/04/2015, 3:28 PM

## 2015-02-06 ENCOUNTER — Encounter: Payer: Self-pay | Admitting: Physical Therapy

## 2015-02-06 ENCOUNTER — Ambulatory Visit: Payer: Worker's Compensation | Admitting: Physical Therapy

## 2015-02-06 DIAGNOSIS — Z7409 Other reduced mobility: Secondary | ICD-10-CM

## 2015-02-06 DIAGNOSIS — R269 Unspecified abnormalities of gait and mobility: Secondary | ICD-10-CM | POA: Diagnosis not present

## 2015-02-06 DIAGNOSIS — R2689 Other abnormalities of gait and mobility: Secondary | ICD-10-CM

## 2015-02-06 NOTE — Therapy (Signed)
[facility] [address] [place], [place], [address] Phone: [phone]   Fax:  [phone]  Physical Therapy Treatment  Patient Details  Name: Marc Taylor MRN: [id number] Date of Birth: 06/09/1961 Referring Provider:  Blair HeysEhinger, Robert, MD  Encounter Date: 02/06/2015      PT End of Session - 02/06/15 0937    Visit Number 7   Number of Visits 18   Date for PT Re-Evaluation 03/14/15   PT Start Time 0932   PT Stop Time 1015   PT Time Calculation (min) 43 min   Equipment Utilized During Treatment Gait belt   Activity Tolerance Patient tolerated treatment well   Behavior [facility] Therapy [facility] for tasks assessed/performed      Past Medical History  Diagnosis Date  . Diabetes mellitus without complication   . Hypertension   . Type II diabetes mellitus     "just dx'd" (08/24/2013)  . History of blood transfusion 1963    "one; I had yellow jaundice" (08/24/2013)  . Compartment syndrome of right foot 09/04/2014  . Fracture of fifth toe, right, closed 09/04/2014  . Constipation     Past Surgical History  Procedure Laterality Date  . Fasciotomy Right 09/04/2014    Procedure: Right Foot FASCIOTOMY and wound vac placement;  Surgeon: [name], MD;  [facility];  Service: Orthopedics;  Laterality: Right;  . Open reduction internal fixation (orif) foot lisfranc fracture Right 09/04/2014    Procedure: OPEN REDUCTION INTERNAL FIXATION (ORIF) FOOT LISFRANC FRACTURE;  Surgeon: [name], MD;  Location: [facility] OR;  Service: Orthopedics;  Laterality: Right;  . I&d extremity Right 09/06/2014    Procedure: Irrigation and Debridement Right Foot, TheraSkin graft and wound vac change out;  Surgeon: [name], MD;  Location: [facility] OR;  Service: Orthopedics;  Laterality: Right;  . Amputation Right 09/18/2014    transmetatarsal         [name]  . Amputation Right 09/18/2014    Procedure: Right Midfoot Amputation;  Surgeon:  [name], MD;  [facility];  Service: Orthopedics;  Laterality: Right;  . Amputation Right 09/19/2014    Procedure: AMPUTATION BELOW KNEE RIGHT;  Surgeon: [name], MD;  Location: [facility] OR;  Service: Orthopedics;  Laterality: Right;  . Stump revision Right 11/01/2014    Procedure: Revision Below Knee Amputation;  Surgeon: [name], MD;  [facility];  Service: Orthopedics;  Laterality: Right;    There were no vitals filed for this visit.  Visit Diagnosis:  Abnormality of gait  Balance problems  Impaired functional mobility and activity tolerance      Subjective Assessment - 02/06/15 0936    Symptoms No new complaints. No falls.  Sore with increased wear time (additional 2 hours)   Currently in Pain? Yes   Pain Score 5    Pain Orientation Lower   Pain Descriptors / Indicators Sore   Pain Onset More than a month ago   Pain Frequency Occasional   Aggravating Factors  increased prosthetic wear, poor sock management   Pain Relieving Factors resting, removing prosthesis           [facility] Adult PT Treatment/Exercise - 02/06/15 0939    Transfers   Sit to Stand 5: Supervision;With upper extremity assist;With armrests;From chair/3-in-1   Stand to Sit 5: Supervision;With upper extremity assist;With armrests;To chair/3-in-1   Ambulation/Gait   Ambulation/Gait Yes   Ambulation/Gait Assistance 4: Min guard;5: Supervision   Ambulation/Gait Assistance  Details cues on posture and to increase step length on left and stance on right leg.                Ambulation Distance (Feet) 550 Feet  x1 outdoor   Assistive device Prosthesis;Straight cane   Gait Pattern Decreased stance time - right;Decreased step length - left;Antalgic   Ambulation Surface Level;Indoor   Stairs Yes   Stairs Assistance 5: Supervision   Stair Management Technique One rail Right;One rail Left;Step to pattern;Forwards;With cane   Number of Stairs 4  x2 reps: 1 with each rail   Ramp --  min guard  assist x 2 reps with cane/prosthesis   Curb --  min guard assist x 2 reps with cane/prosthesis   High Level Balance   High Level Balance Activities Side stepping;Marching forwards;Marching backwards;Tandem walking  tandem gait forward/backward   High Level Balance Comments with UE assist on counter top. 3 laps each /each way. Increased assist needed toward end of tandem due to increased pain with stance on prosthesis.     Knee/Hip Exercises: Aerobic   Stationary Bike Nustep x 4 extremities level 4.0 x 8 minutes with goal >/= 60 steps per minute for strenghtening and activity tolerance.                                     Prosthetics   Current prosthetic wear tolerance (days/week)  7 days/wk   Current prosthetic wear tolerance (#hours/day)  5 hours 2 x day   Residual limb condition  no isssues noted, some heat/reddness at knee area noted   Education Provided Correct ply sock adjustment   Person(s) Educated Patient   Education Method Explanation;Demonstration   Education Method Verbalized understanding   Donning Prosthesis Modified independent (device/increased time)   Doffing Prosthesis Modified independent (device/increased time)           PT Short Term Goals - 01/16/15 1656    PT SHORT TERM GOAL #1   Title Patient tolerates >8hours /day wear without skin issues or limb pain. (Target Date: 02/14/15)   Time 30   Period Days   Status New   PT SHORT TERM GOAL #2   Title Patient verbalizes proper prosthetic care except cues to adjust ply socks. (Target Date: 02/14/15)   Time 30   Period Days   Status New   PT SHORT TERM GOAL #3   Title Patient ambulates 400' with LRAD / prosthesis with cues only. (Target Date: 02/14/15)   Time 30   Period Days   Status New   PT SHORT TERM GOAL #4   Title Patient negotiates ramp & curb with LRAD /prosthesis with cues only. (Target Date: 02/14/15)   PT SHORT TERM GOAL #5   Title [name] Balance >36/56 (Target Date: 02/14/15)   Time 30   Period Days    Status New           PT Long Term Goals - 01/16/15 1707    PT LONG TERM GOAL #1   Title Patient is independent with prosthetic care. (Target Date: 03/14/15)   Time 60   Period Days   Status New   PT LONG TERM GOAL #2   Title Patient tolerates wear >90% of awake hours without skin issues or limb pain. (Target Date: 03/14/15)   Time 60   Period Days   Status New   PT LONG TERM GOAL #3   Title  patient reports no back pain with activities. (Target Date: 03/14/15)   Time 60   Period Days   Status New   PT LONG TERM GOAL #4   Title Patient ambulates >1000' including grass, ramps, curbs, stairs with prosthesis only independently. (Target Date: 03/14/15)   Time 60   Period Days   Status New   PT LONG TERM GOAL #5   Title Patient lifts & carries 30#, climbs ladders, push / pull carts with prosthesis safely. (Target Date: 03/14/15)   Time 60   Period Days   Status New           Plan - 02/06/15 1610    Clinical Impression Statement Pt continues to progress well towards goals. Reported decreased pain with addition of sock ply's.   Pt will benefit from skilled therapeutic intervention in order to improve on the following deficits Abnormal gait;Decreased activity tolerance;Decreased balance;Decreased endurance;Decreased knowledge of use of DME;Decreased mobility;Decreased strength;Pain  prosthetic dependency   Rehab Potential Good   PT Frequency 2x / week   PT Duration Other (comment)  9 weeks    PT Treatment/Interventions ADLs/Self Care Home Management;DME Instruction;Gait training;Stair training;Functional mobility training;Therapeutic activities;Therapeutic exercise;Balance training;Neuromuscular re-education;Patient/family education;Other (comment)  prsothetic training   PT Next Visit Plan Continue with gait with cane, balance and strengthening.   Consulted and Agree with Plan of Care Patient        Problem List Patient Active Problem List   Diagnosis Date Noted  . Leg  wound, right 10/23/2014  . Gas gangrene of foot 09/18/2014  . Compartment syndrome of right foot 09/04/2014  . Fracture of fifth toe, right, closed 09/04/2014  . Compartment syndrome of foot 09/04/2014  . Diabetes type 2, uncontrolled 08/24/2013  . HTN (hypertension) 08/24/2013  . Hyperglycemia 08/24/2013    Sallyanne Kuster 02/06/2015, 2:42 PM  Sallyanne Kuster, PTA, [facility] [place] [address] [place], [place] [address] [phone] 02/06/2015, 2:42 PM

## 2015-02-11 ENCOUNTER — Encounter: Payer: Self-pay | Admitting: Physical Therapy

## 2015-02-11 ENCOUNTER — Ambulatory Visit: Payer: Worker's Compensation | Admitting: Physical Therapy

## 2015-02-11 DIAGNOSIS — R269 Unspecified abnormalities of gait and mobility: Secondary | ICD-10-CM | POA: Diagnosis not present

## 2015-02-11 DIAGNOSIS — Z7409 Other reduced mobility: Secondary | ICD-10-CM

## 2015-02-11 DIAGNOSIS — R2689 Other abnormalities of gait and mobility: Secondary | ICD-10-CM

## 2015-02-12 NOTE — Therapy (Signed)
[facility] [address] [place], [place], [address] Phone: [phone]   Fax:  [phone]  Physical Therapy Treatment  Patient Details  Name: Marc Taylor MRN: [id number] Date of Birth: Aug 16, 1961 Referring Provider:  Gaynelle Arabian, MD  Encounter Date: 02/11/2015      PT End of Session - 02/11/15 1408    Visit Number 8   Number of Visits 18   Date for PT Re-Evaluation 03/14/15   PT Start Time 7473   PT Stop Time 1442   PT Time Calculation (min) 39 min   Equipment Utilized During Treatment Gait belt   Activity Tolerance Patient tolerated treatment well   Behavior During Therapy [facility] for tasks assessed/performed      Past Medical History  Diagnosis Date  . Diabetes mellitus without complication   . Hypertension   . Type II diabetes mellitus     "just dx'd" (08/24/2013)  . History of blood transfusion 1963    "one; I had yellow jaundice" (08/24/2013)  . Compartment syndrome of right foot 09/04/2014  . Fracture of fifth toe, right, closed 09/04/2014  . Constipation     Past Surgical History  Procedure Laterality Date  . Fasciotomy Right 09/04/2014    Procedure: Right Foot FASCIOTOMY and wound vac placement;  Surgeon: Johnny Bridge, MD;  Location: [place];  Service: Orthopedics;  Laterality: Right;  . Open reduction internal fixation (orif) foot lisfranc fracture Right 09/04/2014    Procedure: OPEN REDUCTION INTERNAL FIXATION (ORIF) FOOT LISFRANC FRACTURE;  Surgeon: Johnny Bridge, MD;  Location: [place];  Service: Orthopedics;  Laterality: Right;  . I&d extremity Right 09/06/2014    Procedure: Irrigation and Debridement Right Foot, TheraSkin graft and wound vac change out;  Surgeon: Newt Minion, MD;  Location: [place];  Service: Orthopedics;  Laterality: Right;  . Amputation Right 09/18/2014    transmetatarsal         Dr Sharol Given  . Amputation Right 09/18/2014    Procedure: Right Midfoot Amputation;  Surgeon:  Newt Minion, MD;  Location: [place];  Service: Orthopedics;  Laterality: Right;  . Amputation Right 09/19/2014    Procedure: AMPUTATION BELOW KNEE RIGHT;  Surgeon: Newt Minion, MD;  Location: [place];  Service: Orthopedics;  Laterality: Right;  . Stump revision Right 11/01/2014    Procedure: Revision Below Knee Amputation;  Surgeon: Newt Minion, MD;  Location: [place];  Service: Orthopedics;  Laterality: Right;    There were no vitals filed for this visit.  Visit Diagnosis:  Abnormality of gait  Balance problems  Impaired functional mobility and activity tolerance      Subjective Assessment - 02/11/15 1405    Symptoms No new complaints. No falls.  Reports his back is "tweaking" from sitting in lobby before session.   Currently in Pain? Yes   Pain Score 5    Pain Location Back   Pain Orientation Lower   Pain Descriptors / Indicators Sore   Pain Type Acute pain   Pain Onset 1 to 4 weeks ago   Pain Frequency Occasional   Aggravating Factors  prolonged sitting with poor posture   Pain Relieving Factors stretching, walking/movement           [facility] Adult PT Treatment/Exercise - 02/11/15 1409    Ambulation/Gait   Ambulation/Gait Assistance 5: Supervision   Ambulation Distance (Feet) 440 Feet   Assistive device Prosthesis;Straight cane   Gait Pattern Decreased stance time - right;Decreased step length -  left;Antalgic;Step-through pattern   Ambulation Surface Level;Indoor   Ramp 5: Supervision   Ramp Details (indicate cue type and reason) cues on sequence and step length   Curb 5: Supervision  with cane/prosthesis   Curb Details (indicate cue type and reason) cues on sequence   Berg Balance Test   Sit to Stand Able to stand without using hands and stabilize independently   Standing Unsupported Able to stand safely 2 minutes   Sitting with Back Unsupported but Feet Supported on Floor or Stool Able to sit safely and securely 2 minutes   Stand to Sit Sits safely with minimal use  of hands   Transfers Able to transfer safely, minor use of hands   Standing Unsupported with Eyes Closed Able to stand 10 seconds safely   Standing Ubsupported with Feet Together Able to place feet together independently and stand 1 minute safely   From Standing, Reach Forward with Outstretched Arm Can reach forward >12 cm safely (5")  9 inches   From Standing Position, Pick up Object from Floor Able to pick up shoe safely and easily   From Standing Position, Turn to Look Behind Over each Shoulder Looks behind from both sides and weight shifts well   Turn 360 Degrees Able to turn 360 degrees safely but slowly  >4 sec's each way   Standing Unsupported, Alternately Place Feet on Step/Stool Able to stand independently and safely and complete 8 steps in 20 seconds  18.18 sec's   Standing Unsupported, One Foot in Front Able to place foot tandem independently and hold 30 seconds   Standing on One Leg Able to lift leg independently and hold > 10 seconds   Total Score 53   Prosthetics   Prosthetic Care Comments  heat rash improved with use of deodorant below knee, some heat rash/reddness noted above knee with dry skin. discussed use of baby oil above knee.                                   Current prosthetic wear tolerance (days/week)  7 days/wk   Current prosthetic wear tolerance (#hours/day)  5 hours 2 x day  pt to increase to 6 hours 2 x day   Residual limb condition  intact, some dryness noted above knee   Education Provided Residual limb care;Proper weight-bearing schedule/adjustment   Person(s) Educated Patient   Education Method Explanation;Demonstration;Verbal cues   Education Method Verbalized understanding   Donning Prosthesis Modified independent (device/increased time)   Doffing Prosthesis Modified independent (device/increased time)           PT Short Term Goals - 02/12/15 1241    PT SHORT TERM GOAL #1   Title Patient tolerates >8hours /day wear without skin issues or limb  pain. (Target Date: 02/14/15)   Status Achieved   PT SHORT TERM GOAL #2   Title Patient verbalizes proper prosthetic care except cues to adjust ply socks. (Target Date: 02/14/15)   Status Achieved   PT SHORT TERM GOAL #3   Title Patient ambulates 400' with LRAD / prosthesis with cues only. (Target Date: 02/14/15)   Status Achieved   PT SHORT TERM GOAL #4   Title Patient negotiates ramp & curb with LRAD /prosthesis with cues only. (Target Date: 02/14/15)   Status Achieved   PT SHORT TERM GOAL #5   Title Berg Balance >36/56 (Target Date: 02/14/15)   Baseline 02/11/15: scored 53/54   Status  Achieved           PT Long Term Goals - 01/16/15 1707    PT LONG TERM GOAL #1   Title Patient is independent with prosthetic care. (Target Date: 03/14/15)   Time 60   Period Days   Status New   PT LONG TERM GOAL #2   Title Patient tolerates wear >90% of awake hours without skin issues or limb pain. (Target Date: 03/14/15)   Time 60   Period Days   Status New   PT LONG TERM GOAL #3   Title patient reports no back pain with activities. (Target Date: 03/14/15)   Time 60   Period Days   Status New   PT LONG TERM GOAL #4   Title Patient ambulates >1000' including grass, ramps, curbs, stairs with prosthesis only independently. (Target Date: 03/14/15)   Time 60   Period Days   Status New   PT LONG TERM GOAL #5   Title Patient lifts & carries 30#, climbs ladders, push / pull carts with prosthesis safely. (Target Date: 03/14/15)   Time 60   Period Days   Status New           Plan - 02/11/15 1409    Clinical Impression Statement Pt met all STG's today. Progressing toward LTG's.   Pt will benefit from skilled therapeutic intervention in order to improve on the following deficits Abnormal gait;Decreased activity tolerance;Decreased balance;Decreased endurance;Decreased knowledge of use of DME;Decreased mobility;Decreased strength;Pain  prosthetic dependency   Rehab Potential Good   PT Frequency 2x /  week   PT Duration Other (comment)  9 weeks    PT Treatment/Interventions ADLs/Self Care Home Management;DME Instruction;Gait training;Stair training;Functional mobility training;Therapeutic activities;Therapeutic exercise;Balance training;Neuromuscular re-education;Patient/family education;Other (comment)  prsothetic training   PT Next Visit Plan Continue with gait with cane, balance and strengthening. Gain on complaint/outdoor surfaces weather permitting. continue to monitor leg length discrepancy (lift currently in left shoe to correct).   Consulted and Agree with Plan of Care Patient     Problem List Patient Active Problem List   Diagnosis Date Noted  . Leg wound, right 10/23/2014  . Gas gangrene of foot 09/18/2014  . Compartment syndrome of right foot 09/04/2014  . Fracture of fifth toe, right, closed 09/04/2014  . Compartment syndrome of foot 09/04/2014  . Diabetes type 2, uncontrolled 08/24/2013  . HTN (hypertension) 08/24/2013  . Hyperglycemia 08/24/2013    Willow Ora 02/12/2015, 12:42 PM  Willow Ora, PTA, [facility] [address] [place], [place] [address] [phone] 02/12/2015, 12:42 PM

## 2015-02-13 ENCOUNTER — Ambulatory Visit: Payer: Worker's Compensation | Admitting: Physical Therapy

## 2015-02-13 ENCOUNTER — Encounter: Payer: Self-pay | Admitting: Physical Therapy

## 2015-02-13 DIAGNOSIS — R2689 Other abnormalities of gait and mobility: Secondary | ICD-10-CM

## 2015-02-13 DIAGNOSIS — R269 Unspecified abnormalities of gait and mobility: Secondary | ICD-10-CM

## 2015-02-13 NOTE — Therapy (Signed)
[facility] [address] [place], [place], [address] Phone: [phone]   Fax:  [phone]  Physical Therapy Treatment  Patient Details  Name: Marc Taylor MRN: [id number] Date of Birth: 05-18-1961 Referring Provider:  Blair Heys, MD  Encounter Date: 02/13/2015      PT End of Session - 02/13/15 1453    Visit Number 9   Number of Visits 18   Date for PT Re-Evaluation 03/14/15   PT Start Time 1446   PT Stop Time 1526   PT Time Calculation (min) 40 min   Equipment Utilized During Treatment Gait belt   Activity Tolerance Patient tolerated treatment well   Behavior During Therapy [facility] for tasks assessed/performed      Past Medical History  Diagnosis Date  . Diabetes mellitus without complication   . Hypertension   . Type II diabetes mellitus     "just dx'd" (08/24/2013)  . History of blood transfusion 1963    "one; I had yellow jaundice" (08/24/2013)  . Compartment syndrome of right foot 09/04/2014  . Fracture of fifth toe, right, closed 09/04/2014  . Constipation     Past Surgical History  Procedure Laterality Date  . Fasciotomy Right 09/04/2014    Procedure: Right Foot FASCIOTOMY and wound vac placement;  Surgeon: [name], MD;  Location: [facility] OR;  Service: Orthopedics;  Laterality: Right;  . Open reduction internal fixation (orif) foot lisfranc fracture Right 09/04/2014    Procedure: OPEN REDUCTION INTERNAL FIXATION (ORIF) FOOT LISFRANC FRACTURE;  Surgeon: [name], MD;  Location[facility];  Service: Orthopedics;  Laterality: Right;  . I&d extremity Right 09/06/2014    Procedure: Irrigation and Debridement Right Foot, TheraSkin graft and wound vac change out;  Surgeon: [name], MD;  Location: [facility];  Service: Orthopedics;  Laterality: Right;  . Amputation Right 09/18/2014    transmetatarsal         Dr [name]  . Amputation Right 09/18/2014    Procedure: Right Midfoot Amputation;  Surgeon:  [name], MD;  Location: [facility] OR;  Service: Orthopedics;  Laterality: Right;  . Amputation Right 09/19/2014    Procedure: AMPUTATION BELOW KNEE RIGHT;  Surgeon: [name], MD;  Location[facility];  Service: Orthopedics;  Laterality: Right;  . Stump revision Right 11/01/2014    Procedure: Revision Below Knee Amputation;  Surgeon: [name], MD;  [facility]: [facility] OR;  Service: Orthopedics;  Laterality: Right;    There were no vitals filed for this visit.  Visit Diagnosis:  Abnormality of gait  Balance problems      Subjective Assessment - 02/13/15 1451    Symptoms No new complaints. No falls.    Currently in Pain? Yes   Pain Score 5    Pain Orientation Lower   Pain Descriptors / Indicators Sore;Aching  "tweaking"   Pain Type Chronic pain   Pain Onset 1 to 4 weeks ago   Pain Frequency Occasional   Aggravating Factors  prolonged immobility/sitting   Pain Relieving Factors stretching, walking/movement            [facility] Adult PT Treatment/Exercise - 02/13/15 1454    Transfers   Sit to Stand 5: Supervision;With upper extremity assist;With armrests;From chair/3-in-1   Stand to Sit 5: Supervision;With upper extremity assist;With armrests;To chair/3-in-1   Ambulation/Gait   Ambulation/Gait Yes   Ambulation/Gait Assistance 5: Supervision;4: Min guard  min guard assist without AD   Ambulation/Gait Assistance Details cues on posture and  stride length.   Ambulation Distance (Feet) 1000 Feet  x1; 115 feet x2 without AD   Assistive device Prosthesis;Straight cane   Gait Pattern Decreased stance time - right;Decreased step length - left;Antalgic;Step-through pattern   Ambulation Surface Level;Indoor;Unlevel;Outdoor;Paved   Stairs Yes   Stairs Assistance 5: Supervision;4: Min guard   Stairs Assistance Details (indicate cue type and reason) cues at times for prosthetic foot placement and hand advancement                            Stair Management Technique Two rails;Alternating  pattern;Forwards   Number of Stairs 4  x 5 reps   Prosthetics   Current prosthetic wear tolerance (days/week)  7 days/wk   Current prosthetic wear tolerance (#hours/day)  6 hours 2 x day  drying as needed   Residual limb condition  intact   Education Provided Residual limb care;Correct ply sock adjustment;Proper wear schedule/adjustment   Person(s) Educated Patient   Donning Prosthesis Modified independent (device/increased time)   Doffing Prosthesis Modified independent (device/increased time)     Educated on lifting with prosthesis. Cues on prosthesis placement and body mechanics with lifting/transfers. - with 10# crate from floor, stand pivot transfer to higher mat table, and then back to floor. X 5 reps each way. - lifting 10# crate from floor and carrying 110 feet, including around barriers/furniture, with min guard assist x 2 reps.        PT Short Term Goals - 02/12/15 1241    PT SHORT TERM GOAL #1   Title Patient tolerates >8hours /day wear without skin issues or limb pain. (Target Date: 02/14/15)   Status Achieved   PT SHORT TERM GOAL #2   Title Patient verbalizes proper prosthetic care except cues to adjust ply socks. (Target Date: 02/14/15)   Status Achieved   PT SHORT TERM GOAL #3   Title Patient ambulates 400' with LRAD / prosthesis with cues only. (Target Date: 02/14/15)   Status Achieved   PT SHORT TERM GOAL #4   Title Patient negotiates ramp & curb with LRAD /prosthesis with cues only. (Target Date: 02/14/15)   Status Achieved   PT SHORT TERM GOAL #5   Title [name] Balance >36/56 (Target Date: 02/14/15)   Baseline 02/11/15: scored 53/54   Status Achieved           PT Long Term Goals - 01/16/15 1707    PT LONG TERM GOAL #1   Title Patient is independent with prosthetic care. (Target Date: 03/14/15)   Time 60   Period Days   Status New   PT LONG TERM GOAL #2   Title Patient tolerates wear >90% of awake hours without skin issues or limb pain. (Target Date: 03/14/15)    Time 60   Period Days   Status New   PT LONG TERM GOAL #3   Title patient reports no back pain with activities. (Target Date: 03/14/15)   Time 60   Period Days   Status New   PT LONG TERM GOAL #4   Title Patient ambulates >1000' including grass, ramps, curbs, stairs with prosthesis only independently. (Target Date: 03/14/15)   Time 60   Period Days   Status New   PT LONG TERM GOAL #5   Title Patient lifts & carries 30#, climbs ladders, push / pull carts with prosthesis safely. (Target Date: 03/14/15)   Time 60   Period Days   Status New  Plan - 02/13/15 1453    Clinical Impression Statement Pt making steady progress toward goals.   Pt will benefit from skilled therapeutic intervention in order to improve on the following deficits Abnormal gait;Decreased activity tolerance;Decreased balance;Decreased endurance;Decreased knowledge of use of DME;Decreased mobility;Decreased strength;Pain  prosthetic dependency   Rehab Potential Good   PT Frequency 2x / week   PT Duration Other (comment)  9 weeks    PT Treatment/Interventions ADLs/Self Care Home Management;DME Instruction;Gait training;Stair training;Functional mobility training;Therapeutic activities;Therapeutic exercise;Balance training;Neuromuscular re-education;Patient/family education;Other (comment)  prsothetic training   PT Next Visit Plan Continue with gait without AD, balance and strengthening. Gain on complaint/outdoor surfaces weather permitting. continue to monitor leg length discrepancy (lift currently in left shoe to correct).   Consulted and Agree with Plan of Care Patient        Problem List Patient Active Problem List   Diagnosis Date Noted  . Leg wound, right 10/23/2014  . Gas gangrene of foot 09/18/2014  . Compartment syndrome of right foot 09/04/2014  . Fracture of fifth toe, right, closed 09/04/2014  . Compartment syndrome of foot 09/04/2014  . Diabetes type 2, uncontrolled 08/24/2013  . HTN  (hypertension) 08/24/2013  . Hyperglycemia 08/24/2013    Sallyanne KusterBury, Tashya Alberty 02/14/2015, 11:06 AM  Sallyanne KusterKathy Shantese Raven, PTA, [facility] [address], [address] [place], [place] [id number] [phone] 02/14/2015, 11:06 AM

## 2015-02-17 ENCOUNTER — Encounter: Payer: Self-pay | Admitting: Physical Therapy

## 2015-02-17 ENCOUNTER — Ambulatory Visit: Payer: Worker's Compensation | Attending: Orthopedic Surgery | Admitting: Physical Therapy

## 2015-02-17 DIAGNOSIS — R269 Unspecified abnormalities of gait and mobility: Secondary | ICD-10-CM | POA: Diagnosis not present

## 2015-02-17 DIAGNOSIS — Z7409 Other reduced mobility: Secondary | ICD-10-CM | POA: Insufficient documentation

## 2015-02-17 DIAGNOSIS — R2689 Other abnormalities of gait and mobility: Secondary | ICD-10-CM

## 2015-02-17 DIAGNOSIS — Z89511 Acquired absence of right leg below knee: Secondary | ICD-10-CM | POA: Insufficient documentation

## 2015-02-17 DIAGNOSIS — R29818 Other symptoms and signs involving the nervous system: Secondary | ICD-10-CM | POA: Diagnosis not present

## 2015-02-17 NOTE — Therapy (Signed)
[facility] [facility] [address] [place], [place], [address] Phone: [phone]   Fax:  [phone]  Physical Therapy Treatment  Patient Details  Name: Marc [name] MRN: [id number] Date of Birth: 1961/03/12 Referring Provider:  Blair HeysEhinger, Robert, MD  Encounter Date: 02/17/2015      PT End of Session - 02/17/15 1407    Visit Number 10   Number of Visits 18   Date for PT Re-Evaluation 03/14/15   PT Start Time 1403   PT Stop Time 1445   PT Time Calculation (min) 42 min   Equipment Utilized During Treatment Gait belt   Activity Tolerance Patient tolerated treatment well   Behavior [facility] Therapy [facility] for tasks assessed/performed      Past Medical History  Diagnosis Date  . Diabetes mellitus without complication   . Hypertension   . Type II diabetes mellitus     "just dx'd" (08/24/2013)  . History of blood transfusion 1963    "one; I had yellow jaundice" (08/24/2013)  . Compartment syndrome of right foot 09/04/2014  . Fracture of fifth toe, right, closed 09/04/2014  . Constipation     Past Surgical History  Procedure Laterality Date  . Fasciotomy Right 09/04/2014    Procedure: Right Foot FASCIOTOMY and wound vac placement;  Surgeon: [name], MD;  [facility];  Service: Orthopedics;  Laterality: Right;  . Open reduction internal fixation (orif) foot lisfranc fracture Right 09/04/2014    Procedure: OPEN REDUCTION INTERNAL FIXATION (ORIF) FOOT LISFRANC FRACTURE;  Surgeon: [name], MD;  Location: [facility] OR;  Service: Orthopedics;  Laterality: Right;  . I&d extremity Right 09/06/2014    Procedure: Irrigation and Debridement Right Foot, TheraSkin graft and wound vac change out;  Surgeon: [name], MD;  Location: [facility] OR;  Service: Orthopedics;  Laterality: Right;  . Amputation Right 09/18/2014    transmetatarsal         [name]  . Amputation Right 09/18/2014    Procedure: Right Midfoot Amputation;  Surgeon:  [name], MD;  [facility];  Service: Orthopedics;  Laterality: Right;  . Amputation Right 09/19/2014    Procedure: AMPUTATION BELOW KNEE RIGHT;  Surgeon: [name], MD;  Location: [facility] OR;  Service: Orthopedics;  Laterality: Right;  . Stump revision Right 11/01/2014    Procedure: Revision Below Knee Amputation;  Surgeon: [name], MD;  [facility];  Service: Orthopedics;  Laterality: Right;    There were no vitals filed for this visit.  Visit Diagnosis:  Abnormality of gait  Balance problems  Impaired functional mobility and activity tolerance      Subjective Assessment - 02/17/15 1407    Subjective No new complaints. No falls.    Currently in Pain? No/denies   Pain Score 0-No pain                       [facility] Adult PT Treatment/Exercise - 02/17/15 1408    Transfers   Sit to Stand 5: Supervision;With upper extremity assist;With armrests;From chair/3-in-1   Stand to Sit 5: Supervision;With upper extremity assist;With armrests;To chair/3-in-1   Ambulation/Gait   Ambulation/Gait Yes   Ambulation/Gait Assistance 4: Min guard;4: Min assist   Ambulation/Gait Assistance Details min guard assist without AD with 1 episode of min assist due to knee buckling on right. Increased pain with gait without AD today.    Ambulation Distance (Feet) 220 Feet  x1 no AD; 1000 outside with  cane   Assistive device Prosthesis;Straight cane   Gait Pattern Decreased stance time - right;Decreased step length - left;Antalgic;Step-through pattern   Ambulation Surface Level;Indoor;Outdoor;Paved;Gravel;Grass   Stairs Yes   Stairs Assistance 5: Supervision   Stairs Assistance Details (indicate cue type and reason) minimal cues on prosthetic foot placement with descending stairs   Stair Management Technique Two rails;Alternating pattern;Forwards   Number of Stairs 4  x 5 reps   Dynamic Standing Balance   Dynamic Standing - Balance Support No upper extremity supported;During  functional activity   Dynamic Standing - Level of Assistance 4: Min assist  to min guard assist   Dynamic Standing - Balance Activities Alternating  foot traps   Dynamic Standing - Comments alternating forward toe taps, cross toe taps to both 4 inch box and 6 inch box x 10 each bil legs.                                     Prosthetics   Prosthetic Care Comments  no heat rash present today.   Current prosthetic wear tolerance (days/week)  7 days/wk   Current prosthetic wear tolerance (#hours/day)  6 hours 2 x day  drying as needed   Residual limb condition  intact   Education Provided Residual limb care;Correct ply sock adjustment   Person(s) Educated Patient   Education Method Explanation   Education Method Verbalized understanding   Donning Prosthesis Modified independent (device/increased time)   Doffing Prosthesis Modified independent (device/increased time)      Educated on lifting with prosthesis. Cues on prosthesis placement and body mechanics with lifting/transfers. - with 15# crate from floor, stand pivot transfer to higher mat table, and then back to floor. X 5 reps each way. - lifting 15# crate from floor and carrying 115 feet, including around barriers/furniture, with min guard assist x 1 reps. - lifting 2 10# weights with handles and carrying 115 feet x 1 with cues on posture (simulate carrying grocery bags).        PT Short Term Goals - 02/12/15 1241    PT SHORT TERM GOAL #1   Title Patient tolerates >8hours /day wear without skin issues or limb pain. (Target Date: 02/14/15)   Status Achieved   PT SHORT TERM GOAL #2   Title Patient verbalizes proper prosthetic care except cues to adjust ply socks. (Target Date: 02/14/15)   Status Achieved   PT SHORT TERM GOAL #3   Title Patient ambulates 400' with LRAD / prosthesis with cues only. (Target Date: 02/14/15)   Status Achieved   PT SHORT TERM GOAL #4   Title Patient negotiates ramp & curb with LRAD /prosthesis with cues only.  (Target Date: 02/14/15)   Status Achieved   PT SHORT TERM GOAL #5   Title [name] Balance >36/56 (Target Date: 02/14/15)   Baseline 02/11/15: scored 53/54   Status Achieved           PT Long Term Goals - 01/16/15 1707    PT LONG TERM GOAL #1   Title Patient is independent with prosthetic care. (Target Date: 03/14/15)   Time 60   Period Days   Status New   PT LONG TERM GOAL #2   Title Patient tolerates wear >90% of awake hours without skin issues or limb pain. (Target Date: 03/14/15)   Time 60   Period Days   Status New   PT LONG TERM GOAL #3  Title patient reports no back pain with activities. (Target Date: 03/14/15)   Time 60   Period Days   Status New   PT LONG TERM GOAL #4   Title Patient ambulates >1000' including grass, ramps, curbs, stairs with prosthesis only independently. (Target Date: 03/14/15)   Time 60   Period Days   Status New   PT LONG TERM GOAL #5   Title Patient lifts & carries 30#, climbs ladders, push / pull carts with prosthesis safely. (Target Date: 03/14/15)   Time 60   Period Days   Status New           Plan - 02/17/15 1407    Clinical Impression Statement Pt continues to make progress toward goals. No pain reported after session today.   Pt will benefit from skilled therapeutic intervention in order to improve on the following deficits Abnormal gait;Decreased activity tolerance;Decreased balance;Decreased endurance;Decreased knowledge of use of DME;Decreased mobility;Decreased strength;Pain  prosthetic dependency   Rehab Potential Good   PT Frequency 2x / week   PT Duration Other (comment)  9 weeks    PT Treatment/Interventions ADLs/Self Care Home Management;DME Instruction;Gait training;Stair training;Functional mobility training;Therapeutic activities;Therapeutic exercise;Balance training;Neuromuscular re-education;Patient/family education;Other (comment)  prsothetic training   PT Next Visit Plan Continue with gait without AD, balance and  strengthening. Gait on complaint/outdoor surfaces weather permitting. continue to monitor leg length discrepancy (lift currently in left shoe to correct).   Consulted and Agree with Plan of Care Patient        Problem List Patient Active Problem List   Diagnosis Date Noted  . Leg wound, right 10/23/2014  . Gas gangrene of foot 09/18/2014  . Compartment syndrome of right foot 09/04/2014  . Fracture of fifth toe, right, closed 09/04/2014  . Compartment syndrome of foot 09/04/2014  . Diabetes type 2, uncontrolled 08/24/2013  . HTN (hypertension) 08/24/2013  . Hyperglycemia 08/24/2013    Sallyanne Kuster 02/17/2015, 3:22 PM  Sallyanne Kuster, PTA, [facility] [address] [place], [place] [address] [phone] 02/17/2015, 3:22 PM

## 2015-02-19 ENCOUNTER — Encounter: Payer: Self-pay | Admitting: Physical Therapy

## 2015-02-19 ENCOUNTER — Ambulatory Visit: Payer: Worker's Compensation | Admitting: Physical Therapy

## 2015-02-19 DIAGNOSIS — Z7409 Other reduced mobility: Secondary | ICD-10-CM

## 2015-02-19 DIAGNOSIS — R269 Unspecified abnormalities of gait and mobility: Secondary | ICD-10-CM

## 2015-02-19 DIAGNOSIS — R2689 Other abnormalities of gait and mobility: Secondary | ICD-10-CM

## 2015-02-20 NOTE — Therapy (Signed)
[facility] [address] [place], [place], [address] Phone: [phone]   Fax:  [phone]  Physical Therapy Treatment  Patient Details  Name: Marc Taylor MRN: [id number] Date of Birth: 1961-02-06 Referring Provider:  Blair Heys, MD  Encounter Date: 02/19/2015      PT End of Session - 02/19/15 1321    Visit Number 11   Number of Visits 18   Date for PT Re-Evaluation 03/14/15   PT Start Time 1319   PT Stop Time 1358   PT Time Calculation (min) 39 min   Equipment Utilized During Treatment Gait belt   Activity Tolerance Patient tolerated treatment well   Behavior During Therapy [facility] for tasks assessed/performed      Past Medical History  Diagnosis Date  . Diabetes mellitus without complication   . Hypertension   . Type II diabetes mellitus     "just dx'd" (08/24/2013)  . History of blood transfusion 1963    "one; I had yellow jaundice" (08/24/2013)  . Compartment syndrome of right foot 09/04/2014  . Fracture of fifth toe, right, closed 09/04/2014  . Constipation     Past Surgical History  Procedure Laterality Date  . Fasciotomy Right 09/04/2014    Procedure: Right Foot FASCIOTOMY and wound vac placement;  Surgeon: [name], MD;  Location: [facility] OR;  Service: Orthopedics;  Laterality: Right;  . Open reduction internal fixation (orif) foot lisfranc fracture Right 09/04/2014    Procedure: OPEN REDUCTION INTERNAL FIXATION (ORIF) FOOT LISFRANC FRACTURE;  Surgeon: [name], MD;  Location: [facility] OR;  Service: Orthopedics;  Laterality: Right;  . I&d extremity Right 09/06/2014    Procedure: Irrigation and Debridement Right Foot, TheraSkin graft and wound vac change out;  Surgeon: [name], MD;  Location: [facility] OR;  Service: Orthopedics;  Laterality: Right;  . Amputation Right 09/18/2014    transmetatarsal         Dr [name]  . Amputation Right 09/18/2014    Procedure: Right Midfoot Amputation;  Surgeon:  [name], MD;  [facility];  Service: Orthopedics;  Laterality: Right;  . Amputation Right 09/19/2014    Procedure: AMPUTATION BELOW KNEE RIGHT;  Surgeon: [name], MD;  Location: [facility] OR;  Service: Orthopedics;  Laterality: Right;  . Stump revision Right 11/01/2014    Procedure: Revision Below Knee Amputation;  Surgeon: [name], MD;  Location: [facility] OR;  Service: Orthopedics;  Laterality: Right;    There were no vitals filed for this visit.  Visit Diagnosis:  Abnormality of gait  Balance problems  Impaired functional mobility and activity tolerance      Subjective Assessment - 02/19/15 1320    Subjective No new complaints. No falls. Used push mower yesterday on level grass, left the hills for roommate to do.    Currently in Pain? Yes   Pain Score 5    Pain Location Back   Pain Orientation Lower   Pain Descriptors / Indicators Aching;Sore  "tweaking"   Pain Type Chronic pain   Aggravating Factors  prolonged immobility/sitting   Pain Relieving Factors stretching/walking/movement           [facility] Adult PT Treatment/Exercise - 02/19/15 1322    Transfers   Sit to Stand 6: Modified independent (Device/Increase time);With upper extremity assist;With armrests;From chair/3-in-1;Without upper extremity assist   Stand to Sit 6: Modified independent (Device/Increase time);With upper extremity assist;Without upper extremity assist;To chair/3-in-1;With armrests   Ambulation/Gait   Ambulation/Gait Yes  Ambulation/Gait Assistance 4: Min guard   Ambulation/Gait Assistance Details cues to decrease left leg abduction with gait on both indoor and outdoor surfaces.   Ambulation Distance (Feet) 440 Feet  x1 no device; 1000 feet outside no device/   Assistive device Prosthesis;Straight cane   Gait Pattern Decreased stance time - right;Decreased step length - left;Antalgic;Step-through pattern   Ambulation Surface Level;Unlevel;Indoor;Outdoor;Paved;Gravel   Stairs Yes   Stairs  Assistance 5: Supervision   Stairs Assistance Details (indicate cue type and reason) occasional cues on posture and prosthetic foot placement with descending stairs.   Stair Management Technique Two rails;Alternating pattern;Forwards   Number of Stairs 4  x 5 reps   Ramp 5: Supervision  x 3 reps   Ramp Details (indicate cue type and reason) cues on posture and step length   Curb 5: Supervision  x 3 reps   Curb Details (indicate cue type and reason) cues on posture   Prosthetics   Current prosthetic wear tolerance (days/week)  7 days/wk   Current prosthetic wear tolerance (#hours/day)  6 hours 2 x day  drying as needed   Residual limb condition  intact   Education Provided Correct ply sock adjustment  water seal bag   Person(s) Educated Patient   Education Method Explanation;Demonstration   Education Method Verbalized understanding   Donning Prosthesis Modified independent (device/increased time)   Doffing Prosthesis Modified independent (device/increased time)           PT Short Term Goals - 02/12/15 1241    PT SHORT TERM GOAL #1   Title Patient tolerates >8hours /day wear without skin issues or limb pain. (Target Date: 02/14/15)   Status Achieved   PT SHORT TERM GOAL #2   Title Patient verbalizes proper prosthetic care except cues to adjust ply socks. (Target Date: 02/14/15)   Status Achieved   PT SHORT TERM GOAL #3   Title Patient ambulates 400' with LRAD / prosthesis with cues only. (Target Date: 02/14/15)   Status Achieved   PT SHORT TERM GOAL #4   Title Patient negotiates ramp & curb with LRAD /prosthesis with cues only. (Target Date: 02/14/15)   Status Achieved   PT SHORT TERM GOAL #5   Title [name] Balance >36/56 (Target Date: 02/14/15)   Baseline 02/11/15: scored 53/54   Status Achieved           PT Long Term Goals - 01/16/15 1707    PT LONG TERM GOAL #1   Title Patient is independent with prosthetic care. (Target Date: 03/14/15)   Time 60   Period Days   Status New    PT LONG TERM GOAL #2   Title Patient tolerates wear >90% of awake hours without skin issues or limb pain. (Target Date: 03/14/15)   Time 60   Period Days   Status New   PT LONG TERM GOAL #3   Title patient reports no back pain with activities. (Target Date: 03/14/15)   Time 60   Period Days   Status New   PT LONG TERM GOAL #4   Title Patient ambulates >1000' including grass, ramps, curbs, stairs with prosthesis only independently. (Target Date: 03/14/15)   Time 60   Period Days   Status New   PT LONG TERM GOAL #5   Title Patient lifts & carries 30#, climbs ladders, push / pull carts with prosthesis safely. (Target Date: 03/14/15)   Time 60   Period Days   Status New  Plan - 02/19/15 1321    Clinical Impression Statement Pt making steady progress toward goals. Plans to get a water seal bag for showers and pool use with prosthesis.l   Pt will benefit from skilled therapeutic intervention in order to improve on the following deficits Abnormal gait;Decreased activity tolerance;Decreased balance;Decreased endurance;Decreased knowledge of use of DME;Decreased mobility;Decreased strength;Pain  prosthetic dependency   Rehab Potential Good   PT Frequency 2x / week   PT Duration Other (comment)  9 weeks    PT Treatment/Interventions ADLs/Self Care Home Management;DME Instruction;Gait training;Stair training;Functional mobility training;Therapeutic activities;Therapeutic exercise;Balance training;Neuromuscular re-education;Patient/family education;Other (comment)  prsothetic training   PT Next Visit Plan Continue with gait without AD, balance and strengthening. Gait on complaint/outdoor surfaces weather permitting. continue to monitor leg length discrepancy (lift currently in left shoe to correct).   Consulted and Agree with Plan of Care Patient        Problem List Patient Active Problem List   Diagnosis Date Noted  . Leg wound, right 10/23/2014  . Gas gangrene of foot  09/18/2014  . Compartment syndrome of right foot 09/04/2014  . Fracture of fifth toe, right, closed 09/04/2014  . Compartment syndrome of foot 09/04/2014  . Diabetes type 2, uncontrolled 08/24/2013  . HTN (hypertension) 08/24/2013  . Hyperglycemia 08/24/2013    Sallyanne Kuster 02/20/2015, 4:14 PM  Sallyanne Kuster, PTA, [facility] [address], [address] [place], [place] [address] [phone] 02/20/2015, 4:14 PM

## 2015-02-21 ENCOUNTER — Encounter: Payer: Self-pay | Admitting: Physical Therapy

## 2015-02-24 ENCOUNTER — Ambulatory Visit: Payer: Worker's Compensation | Attending: Orthopedic Surgery | Admitting: Physical Therapy

## 2015-02-24 ENCOUNTER — Encounter: Payer: Self-pay | Admitting: Physical Therapy

## 2015-02-24 DIAGNOSIS — Z7409 Other reduced mobility: Secondary | ICD-10-CM | POA: Insufficient documentation

## 2015-02-24 DIAGNOSIS — R2689 Other abnormalities of gait and mobility: Secondary | ICD-10-CM

## 2015-02-24 DIAGNOSIS — R29818 Other symptoms and signs involving the nervous system: Secondary | ICD-10-CM | POA: Diagnosis not present

## 2015-02-24 DIAGNOSIS — R269 Unspecified abnormalities of gait and mobility: Secondary | ICD-10-CM | POA: Diagnosis not present

## 2015-02-24 NOTE — Therapy (Signed)
[facility] [address] [place], [place], [address] Phone: [phone]   Fax:  [phone]  Physical Therapy Treatment  Patient Details  Name: Marc Taylor MRN: [id number] Date of Birth: 1961-08-30 Referring Provider:  Nadara Mustarduda, [name], MD  Encounter Date: 02/24/2015      PT End of Session - 02/24/15 1415    Visit Number 12   Number of Visits 18   Date for PT Re-Evaluation 03/14/15   PT Start Time 1410   PT Stop Time 1448   PT Time Calculation (min) 38 min   Equipment Utilized During Treatment Gait belt   Activity Tolerance Patient tolerated treatment well   Behavior During Therapy [facility] for tasks assessed/performed      Past Medical History  Diagnosis Date  . Diabetes mellitus without complication   . Hypertension   . Type II diabetes mellitus     "just dx'd" (08/24/2013)  . History of blood transfusion 1963    "one; I had yellow jaundice" (08/24/2013)  . Compartment syndrome of right foot 09/04/2014  . Fracture of fifth toe, right, closed 09/04/2014  . Constipation     Past Surgical History  Procedure Laterality Date  . Fasciotomy Right 09/04/2014    Procedure: Right Foot FASCIOTOMY and wound vac placement;  Surgeon: [name], MD;  [facility];  Service: Orthopedics;  Laterality: Right;  . Open reduction internal fixation (orif) foot lisfranc fracture Right 09/04/2014    Procedure: OPEN REDUCTION INTERNAL FIXATION (ORIF) FOOT LISFRANC FRACTURE;  Surgeon: [name], MD;  Location: [facility] OR;  Service: Orthopedics;  Laterality: Right;  . I&d extremity Right 09/06/2014    Procedure: Irrigation and Debridement Right Foot, TheraSkin graft and wound vac change out;  Surgeon: [name], MD;  Location: [facility] OR;  Service: Orthopedics;  Laterality: Right;  . Amputation Right 09/18/2014    transmetatarsal         [name]  . Amputation Right 09/18/2014    Procedure: Right Midfoot Amputation;  Surgeon:  [name], MD;  [facility];  Service: Orthopedics;  Laterality: Right;  . Amputation Right 09/19/2014    Procedure: AMPUTATION BELOW KNEE RIGHT;  Surgeon: [name], MD;  Location: [facility] OR;  Service: Orthopedics;  Laterality: Right;  . Stump revision Right 11/01/2014    Procedure: Revision Below Knee Amputation;  Surgeon: [name], MD;  [facility];  Service: Orthopedics;  Laterality: Right;    There were no vitals filed for this visit.  Visit Diagnosis:  Abnormality of gait  Balance problems  Impaired functional mobility and activity tolerance      Subjective Assessment - 02/24/15 1413    Subjective No new complaints. No falls.    Currently in Pain? Yes   Pain Score 5    Pain Location Back   Pain Orientation Lower   Pain Descriptors / Indicators Aching;Sore;Radiating   Pain Type Chronic pain   Pain Radiating Towards into buttocks   Pain Onset 1 to 4 weeks ago   Pain Frequency Occasional   Aggravating Factors  prolonged immobility/sitting   Pain Relieving Factors stetching/walking/movement          [facility] Adult PT Treatment/Exercise - 02/24/15 1416    Ambulation/Gait   Ambulation/Gait Yes   Ambulation/Gait Assistance 4: Min guard;5: Supervision   Ambulation/Gait Assistance Details cues on posture and prosthetic position with gait ( not to abduct it out).   Ambulation Distance (Feet) 1000 Feet  x1   Assistive device Prosthesis   Gait Pattern Step-through pattern;Decreased stance time - right;Decreased step length - left;Antalgic   Ambulation Surface Level;Unlevel;Indoor;Outdoor;Paved   Stairs Yes   Stairs Assistance 5: Supervision   Stairs Assistance Details (indicate cue type and reason) occasional cues on prosthetic foot placement, cues on posture. focus was on decreased time and pause between steps while descending.                                                       Stair Management Technique Two rails;Alternating pattern;Forwards   Number of  Stairs 4  x 5 reps   Dynamic Standing Balance   Dynamic Standing - Balance Support No upper extremity supported;During functional activity  intermittent UE assist on bars for balance   Dynamic Standing - Level of Assistance 4: Min assist   Dynamic Standing - Balance Activities Rocker board;Foam balance beam;Alternating  foot traps;Head turns;Head nods;Eyes closed   Dynamic Standing - Comments both ways on balance board: hold steady with eyes closed, hold steady with eyes open with head turns/nods; standing with feet across red foam beam: alternating forward heel taps x 10 each side.                               Prosthetics   Prosthetic Care Comments  Saw MD with no issues. Has heard from [facility], they are waiting on check to arrive and then he can go a sign up. Went by [employer] for [employer], clinical, they had to order one that would fit him.                               Current prosthetic wear tolerance (days/week)  7 days/wk   Current prosthetic wear tolerance (#hours/Taylor)  6 hours 2 x Taylor  pt to increase to all awake hours   Residual limb condition  intact   Education Provided Residual limb care;Correct ply sock adjustment;Proper wear schedule/adjustment   Person(s) Educated Patient   Education Method Explanation   Education Method Verbalized understanding   Donning Prosthesis Modified independent (device/increased time)   Doffing Prosthesis Modified independent (device/increased time)            PT Short Term Goals - 02/12/15 1241    PT SHORT TERM GOAL #1   Title Patient tolerates >8hours /Taylor wear without skin issues or limb pain. (Target Date: 02/14/15)   Status Achieved   PT SHORT TERM GOAL #2   Title Patient verbalizes proper prosthetic care except cues to adjust ply socks. (Target Date: 02/14/15)   Status Achieved   PT SHORT TERM GOAL #3   Title Patient ambulates 400' with LRAD / prosthesis with cues only. (Target Date: 02/14/15)   Status Achieved   PT SHORT TERM GOAL #4   Title  Patient negotiates ramp & curb with LRAD /prosthesis with cues only. (Target Date: 02/14/15)   Status Achieved   PT SHORT TERM GOAL #5   Title [name] Balance >36/56 (Target Date: 02/14/15)   Baseline 02/11/15: scored 53/54   Status Achieved           PT Long Term Goals - 01/16/15 1707    PT LONG TERM GOAL #1  Title Patient is independent with prosthetic care. (Target Date: 03/14/15)   Time 60   Period Days   Status New   PT LONG TERM GOAL #2   Title Patient tolerates wear >90% of awake hours without skin issues or limb pain. (Target Date: 03/14/15)   Time 60   Period Days   Status New   PT LONG TERM GOAL #3   Title patient reports no back pain with activities. (Target Date: 03/14/15)   Time 60   Period Days   Status New   PT LONG TERM GOAL #4   Title Patient ambulates >1000' including grass, ramps, curbs, stairs with prosthesis only independently. (Target Date: 03/14/15)   Time 60   Period Days   Status New   PT LONG TERM GOAL #5   Title Patient lifts & carries 30#, climbs ladders, push / pull carts with prosthesis safely. (Target Date: 03/14/15)   Time 60   Period Days   Status New           Plan - 02/24/15 1415    Clinical Impression Statement Pt continues to be challenged by high level balance activites and compliant surfaces. Progressing toward goals.   Pt will benefit from skilled therapeutic intervention in order to improve on the following deficits Abnormal gait;Decreased activity tolerance;Decreased balance;Decreased endurance;Decreased knowledge of use of DME;Decreased mobility;Decreased strength;Pain  prosthetic dependency   Rehab Potential Good   PT Frequency 2x / week   PT Duration Other (comment)  9 weeks    PT Treatment/Interventions ADLs/Self Care Home Management;DME Instruction;Gait training;Stair training;Functional mobility training;Therapeutic activities;Therapeutic exercise;Balance training;Neuromuscular re-education;Patient/family education;Other  (comment)  prsothetic training   PT Next Visit Plan Continue with gait without AD, balance and strengthening. Gait on complaint/outdoor surfaces weather permitting. continue to monitor leg length discrepancy (lift currently in left shoe to correct).   Consulted and Agree with Plan of Care Patient        Problem List Patient Active Problem List   Diagnosis Date Noted  . Leg wound, right 10/23/2014  . Gas gangrene of foot 09/18/2014  . Compartment syndrome of right foot 09/04/2014  . Fracture of fifth toe, right, closed 09/04/2014  . Compartment syndrome of foot 09/04/2014  . Diabetes type 2, uncontrolled 08/24/2013  . HTN (hypertension) 08/24/2013  . Hyperglycemia 08/24/2013    Sallyanne Kuster 02/24/2015, 5:43 PM  Sallyanne Kuster, PTA, [facility] [address] [place], [place] [address] [phone] 02/24/2015, 5:43 PM

## 2015-02-26 ENCOUNTER — Encounter: Payer: Self-pay | Admitting: Physical Therapy

## 2015-02-26 ENCOUNTER — Ambulatory Visit: Payer: Worker's Compensation | Admitting: Physical Therapy

## 2015-02-26 DIAGNOSIS — R269 Unspecified abnormalities of gait and mobility: Secondary | ICD-10-CM | POA: Diagnosis not present

## 2015-02-26 DIAGNOSIS — R2689 Other abnormalities of gait and mobility: Secondary | ICD-10-CM

## 2015-02-26 DIAGNOSIS — Z7409 Other reduced mobility: Secondary | ICD-10-CM

## 2015-02-26 NOTE — Therapy (Signed)
[facility] [address] [place], [place], [address] Phone: [phone]   Fax:  [phone]  Physical Therapy Treatment  Patient Details  Name: Marc Taylor MRN: [id number] Date of Birth: 1961/10/12 Referring Provider:  [name], MD  Encounter Date: 02/26/2015      PT End of Session - 02/26/15 1452    Visit Number 13   Number of Visits 18   Date for PT Re-Evaluation 03/14/15   PT Start Time 1447   PT Stop Time 1528   PT Time Calculation (min) 41 min   Equipment Utilized During Treatment Gait belt   Activity Tolerance Patient tolerated treatment well   Behavior During Therapy [facility] for tasks assessed/performed      Past Medical History  Diagnosis Date  . Diabetes mellitus without complication   . Hypertension   . Type II diabetes mellitus     "just dx'd" (08/24/2013)  . History of blood transfusion 1963    "one; I had yellow jaundice" (08/24/2013)  . Compartment syndrome of right foot 09/04/2014  . Fracture of fifth toe, right, closed 09/04/2014  . Constipation     Past Surgical History  Procedure Laterality Date  . Fasciotomy Right 09/04/2014    Procedure: Right Foot FASCIOTOMY and wound vac placement;  Surgeon: [name], MD;  [facility] OR;  Service: Orthopedics;  Laterality: Right;  . Open reduction internal fixation (orif) foot lisfranc fracture Right 09/04/2014    Procedure: OPEN REDUCTION INTERNAL FIXATION (ORIF) FOOT LISFRANC FRACTURE;  Surgeon: [name], MD;  Location: [facility] OR;  Service: Orthopedics;  Laterality: Right;  . I&d extremity Right 09/06/2014    Procedure: Irrigation and Debridement Right Foot, TheraSkin graft and wound vac change out;  Surgeon: [name], MD;  Location: [facility] OR;  Service: Orthopedics;  Laterality: Right;  . Amputation Right 09/18/2014    transmetatarsal         Dr [name]  . Amputation Right 09/18/2014    Procedure: Right Midfoot Amputation;  Surgeon:  [name], MD;  Location: [facility] OR;  Service: Orthopedics;  Laterality: Right;  . Amputation Right 09/19/2014    Procedure: AMPUTATION BELOW KNEE RIGHT;  Surgeon: [name], MD;  Location: [facility] OR;  Service: Orthopedics;  Laterality: Right;  . Stump revision Right 11/01/2014    Procedure: Revision Below Knee Amputation;  Surgeon: [name], MD;  Location: [facility] OR;  Service: Orthopedics;  Laterality: Right;    There were no vitals filed for this visit.  Visit Diagnosis:  Abnormality of gait  Balance problems  Impaired functional mobility and activity tolerance      Subjective Assessment - 02/26/15 1449    Subjective No new complaints. No falls to report.   Pain Score 5    Pain Location Back   Pain Orientation Lower   Pain Descriptors / Indicators Aching;Sore;Radiating   Pain Type Chronic pain   Pain Radiating Towards into buttocks   Pain Onset 1 to 4 weeks ago   Pain Frequency Occasional   Aggravating Factors  prolonged immobility/sitting   Pain Relieving Factors stretching/walking/movement            [facility] Adult PT Treatment/Exercise - 02/26/15 1455    Ambulation/Gait   Ambulation/Gait Yes   Ambulation/Gait Assistance 4: Min guard;5: Supervision   Ambulation/Gait Assistance Details cues on equal step length and for prosthetic placement with gait (varies from adductied to abducted)  Ambulation Distance (Feet) 1000 Feet  or more   Assistive device Prosthesis   Gait Pattern Step-through pattern;Decreased stance time - right;Decreased step length - left;Antalgic   Ambulation Surface Level;Unlevel;Indoor;Outdoor;Paved   Stairs Yes   Stairs Assistance 5: Supervision   Stairs Assistance Details (indicate cue type and reason) occasional cues for correct prosthetic placement with descending stairs. performed multiple reps for strengthening and education                              Stair Management Technique Two rails;Alternating pattern;Forwards    Number of Stairs 4  x 3 reps   Prosthetics   Prosthetic Care Comments  Discussed use of drysol for control of sweating on limb with use of liner/prosthesis. Leg length checked with prosthesis found to be longer, heel wedge added to left shoe to equal length out (1/4 inch).                             Current prosthetic wear tolerance (days/week)  7 days/wk   Current prosthetic wear tolerance (#hours/day)  all awake hours  drying as needed   Residual limb condition  intact   Education Provided Residual limb care;Proper wear schedule/adjustment;Correct ply sock adjustment  laser hair removal indications   Person(s) Educated Patient   Education Method Explanation   Education Method Verbalized understanding   Donning Prosthesis Modified independent (device/increased time)   Doffing Prosthesis Modified independent (device/increased time)      Treatment On mat Single =knee to chest stretch 20 sec hold x 3 reps each leg Double knee to chest stretch 20 sec hold x 3 reps Lower trunk rotation left/right stretch 20 sec hold x 3 reps each way Seated hamstring stretch 20 sec's x 2 each side         PT Short Term Goals - 02/12/15 1241    PT SHORT TERM GOAL #1   Title Patient tolerates >8hours /day wear without skin issues or limb pain. (Target Date: 02/14/15)   Status Achieved   PT SHORT TERM GOAL #2   Title Patient verbalizes proper prosthetic care except cues to adjust ply socks. (Target Date: 02/14/15)   Status Achieved   PT SHORT TERM GOAL #3   Title Patient ambulates 400' with LRAD / prosthesis with cues only. (Target Date: 02/14/15)   Status Achieved   PT SHORT TERM GOAL #4   Title Patient negotiates ramp & curb with LRAD /prosthesis with cues only. (Target Date: 02/14/15)   Status Achieved   PT SHORT TERM GOAL #5   Title [name] Balance >36/56 (Target Date: 02/14/15)   Baseline 02/11/15: scored 53/54   Status Achieved           PT Long Term Goals - 01/16/15 1707    PT LONG TERM  GOAL #1   Title Patient is independent with prosthetic care. (Target Date: 03/14/15)   Time 60   Period Days   Status New   PT LONG TERM GOAL #2   Title Patient tolerates wear >90% of awake hours without skin issues or limb pain. (Target Date: 03/14/15)   Time 60   Period Days   Status New   PT LONG TERM GOAL #3   Title patient reports no back pain with activities. (Target Date: 03/14/15)   Time 60   Period Days   Status New   PT LONG TERM GOAL #4  Title Patient ambulates >1000' including grass, ramps, curbs, stairs with prosthesis only independently. (Target Date: 03/14/15)   Time 60   Period Days   Status New   PT LONG TERM GOAL #5   Title Patient lifts & carries 30#, climbs ladders, push / pull carts with prosthesis safely. (Target Date: 03/14/15)   Time 60   Period Days   Status New           Plan - 02/26/15 1452    Clinical Impression Statement Pt with back pain today limiting session. Educated on stretches to address this. Pt also continues to have a longer leg length on prosthetic side. Will have pt follow up with [employer] to have this addressed. Pt making steady progress toward goals.   Pt will benefit from skilled therapeutic intervention in order to improve on the following deficits Abnormal gait;Decreased activity tolerance;Decreased balance;Decreased endurance;Decreased knowledge of use of DME;Decreased mobility;Decreased strength;Pain  prosthetic dependency   Rehab Potential Good   PT Frequency 2x / week   PT Duration Other (comment)  9 weeks    PT Treatment/Interventions ADLs/Self Care Home Management;DME Instruction;Gait training;Stair training;Functional mobility training;Therapeutic activities;Therapeutic exercise;Balance training;Neuromuscular re-education;Patient/family education;Other (comment)  prsothetic training   PT Next Visit Plan Continue with gait without AD, balance and strengthening. Gait on complaint/outdoor surfaces weather permitting. See if pt  followed up with [employer] for prosthetic limb adjustement   Consulted and Agree with Plan of Care Patient      Problem List Patient Active Problem List   Diagnosis Date Noted  . Leg wound, right 10/23/2014  . Gas gangrene of foot 09/18/2014  . Compartment syndrome of right foot 09/04/2014  . Fracture of fifth toe, right, closed 09/04/2014  . Compartment syndrome of foot 09/04/2014  . Diabetes type 2, uncontrolled 08/24/2013  . HTN (hypertension) 08/24/2013  . Hyperglycemia 08/24/2013    Sallyanne KusterBury, Kathy 02/26/2015, 9:50 PM  Sallyanne KusterKathy Bury, PTA, [facility] [address], [address] [place], [place] [address] [phone] 02/26/2015, 9:50 PM

## 2015-02-28 ENCOUNTER — Ambulatory Visit: Payer: Worker's Compensation | Admitting: Physical Therapy

## 2015-02-28 ENCOUNTER — Encounter: Payer: Self-pay | Admitting: Physical Therapy

## 2015-02-28 DIAGNOSIS — R2689 Other abnormalities of gait and mobility: Secondary | ICD-10-CM

## 2015-02-28 DIAGNOSIS — Z7409 Other reduced mobility: Secondary | ICD-10-CM

## 2015-02-28 DIAGNOSIS — R269 Unspecified abnormalities of gait and mobility: Secondary | ICD-10-CM | POA: Diagnosis not present

## 2015-03-02 NOTE — Therapy (Signed)
[facility] [address] [place], [place], [address] Phone: [phone]   Fax:  [phone]  Physical Therapy Treatment  Patient Details  Name: Marc Taylor MRN: [id number] Date of Birth: March 02, 1961 Referring Provider:  Blair HeysEhinger, Robert, MD  Encounter Date: 02/28/2015    02/28/15 1406  PT Visits / Re-Eval  Visit Number 14  Number of Visits 18  Date for PT Re-Evaluation 03/14/15  PT Time Calculation  PT Start Time 1403  PT Stop Time 1445  PT Time Calculation (min) 42 min  PT - End of Session  Equipment Utilized During Treatment Gait belt  Activity Tolerance Patient tolerated treatment well  Behavior [facility] Therapy [facility] for tasks assessed/performed     Past Medical History  Diagnosis Date  . Diabetes mellitus without complication   . Hypertension   . Type II diabetes mellitus     "just dx'd" (08/24/2013)  . History of blood transfusion 1963    "one; I had yellow jaundice" (08/24/2013)  . Compartment syndrome of right foot 09/04/2014  . Fracture of fifth toe, right, closed 09/04/2014  . Constipation     Past Surgical History  Procedure Laterality Date  . Fasciotomy Right 09/04/2014    Procedure: Right Foot FASCIOTOMY and wound vac placement;  Surgeon: [name], MD;  [facility];  Service: Orthopedics;  Laterality: Right;  . Open reduction internal fixation (orif) foot lisfranc fracture Right 09/04/2014    Procedure: OPEN REDUCTION INTERNAL FIXATION (ORIF) FOOT LISFRANC FRACTURE;  Surgeon: [name], MD;  [facility] OR;  Service: Orthopedics;  Laterality: Right;  . I&d extremity Right 09/06/2014    Procedure: Irrigation and Debridement Right Foot, TheraSkin graft and wound vac change out;  Surgeon: [name], MD;  [facility] OR;  Service: Orthopedics;  Laterality: Right;  . Amputation Right 09/18/2014    transmetatarsal         [name]  . Amputation Right 09/18/2014    Procedure:  Right Midfoot Amputation;  Surgeon: [name], MD;  [facility];  Service: Orthopedics;  Laterality: Right;  . Amputation Right 09/19/2014    Procedure: AMPUTATION BELOW KNEE RIGHT;  Surgeon: [name], MD;  [facility];  Service: Orthopedics;  Laterality: Right;  . Stump revision Right 11/01/2014    Procedure: Revision Below Knee Amputation;  Surgeon: [name], MD;  [facility];  Service: Orthopedics;  Laterality: Right;    There were no vitals filed for this visit.  Visit Diagnosis:  Abnormality of gait  Balance problems  Impaired functional mobility and activity tolerance      02/28/15 1407  Transfers  Sit to Stand 6: Modified independent (Device/Increase time);With upper extremity assist;With armrests;From chair/3-in-1;Without upper extremity assist  Stand to Sit 6: Modified independent (Device/Increase time);With upper extremity assist;Without upper extremity assist;To chair/3-in-1;With armrests  Ambulation/Gait  Ambulation/Gait Yes  Ambulation/Gait Assistance 4: Min guard;5: Supervision  Ambulation/Gait Assistance Details cues on equal step length, equal stance time and posture  Ambulation Distance (Feet) 1000 Feet (x 1)  Assistive device Prosthesis  Gait Pattern Step-through pattern;Decreased stance time - right;Decreased step length - left;Antalgic  Ambulation Surface Level;Unlevel;Indoor;Outdoor;Paved  High Level Balance  High Level Balance Activities Side stepping;Marching forwards;Marching backwards;Head turns (forward cross overs)  High Level Balance Comments on blue mat in parallel bars: 3 laps each/each way with intermitent UE support on the bars        Knee/Hip Exercises: Aerobic  Stationary Bike seated in  chair: using floor cycle to learn about prosthetic placement and use on a bike                           Elliptical level 1.0 x 1 minute forward only with bil UE support  Prosthetics  Prosthetic Care Comments  Call placed to [employer]  regarding needing a lenght adjustment on prosthesis due to too long and also to have pads added to socket. Pt to go by after session today for these issues.  Current prosthetic wear tolerance (days/week)  7 days/wk  Current prosthetic wear tolerance (#hours/day)  all awake hours (drying as needed)  Residual limb condition  intact  Education Provided Residual limb care;Correct ply sock adjustment;Proper wear schedule/adjustment  Person(s) Educated Patient  Education Method Explanation;Demonstration  Education Method Verbalized understanding  Donning Prosthesis 6  Doffing Prosthesis 6         PT Short Term Goals - 02/12/15 1241    PT SHORT TERM GOAL #1   Title Patient tolerates >8hours /day wear without skin issues or limb pain. (Target Date: 02/14/15)   Status Achieved   PT SHORT TERM GOAL #2   Title Patient verbalizes proper prosthetic care except cues to adjust ply socks. (Target Date: 02/14/15)   Status Achieved   PT SHORT TERM GOAL #3   Title Patient ambulates 400' with LRAD / prosthesis with cues only. (Target Date: 02/14/15)   Status Achieved   PT SHORT TERM GOAL #4   Title Patient negotiates ramp & curb with LRAD /prosthesis with cues only. (Target Date: 02/14/15)   Status Achieved   PT SHORT TERM GOAL #5   Title [name] Balance >36/56 (Target Date: 02/14/15)   Baseline 02/11/15: scored 53/54   Status Achieved           PT Long Term Goals - 01/16/15 1707    PT LONG TERM GOAL #1   Title Patient is independent with prosthetic care. (Target Date: 03/14/15)   Time 60   Period Days   Status New   PT LONG TERM GOAL #2   Title Patient tolerates wear >90% of awake hours without skin issues or limb pain. (Target Date: 03/14/15)   Time 60   Period Days   Status New   PT LONG TERM GOAL #3   Title patient reports no back pain with activities. (Target Date: 03/14/15)   Time 60   Period Days   Status New   PT LONG TERM GOAL #4   Title Patient ambulates >1000' including grass, ramps,  curbs, stairs with prosthesis only independently. (Target Date: 03/14/15)   Time 60   Period Days   Status New   PT LONG TERM GOAL #5   Title Patient lifts & carries 30#, climbs ladders, push / pull carts with prosthesis safely. (Target Date: 03/14/15)   Time 60   Period Days   Status New        02/28/15 1407  Plan  Clinical Impression Statement Pt making steady progress with mobility toward goals.   Pt will benefit from skilled therapeutic intervention in order to improve on the following deficits Abnormal gait;Decreased activity tolerance;Decreased balance;Decreased endurance;Decreased knowledge of use of DME;Decreased mobility;Decreased strength;Pain (prosthetic dependency)  Rehab Potential Good  PT Frequency 2x / week  PT Duration Other (comment) (9 weeks )  PT Treatment/Interventions ADLs/Self Care Home Management;DME Instruction;Gait training;Stair training;Functional mobility training;Therapeutic activities;Therapeutic exercise;Balance training;Neuromuscular re-education;Patient/family education;Other (comment) (prsothetic training)  PT Next Visit Plan  Continue with gait without AD, balance and strengthening. Gait on complaint/outdoor surfaces weather permitting. See if pt followed up with [employer] for prosthetic limb adjustement  Consulted and Agree with Plan of Care Patient    Problem List Patient Active Problem List   Diagnosis Date Noted  . Leg wound, right 10/23/2014  . Gas gangrene of foot 09/18/2014  . Compartment syndrome of right foot 09/04/2014  . Fracture of fifth toe, right, closed 09/04/2014  . Compartment syndrome of foot 09/04/2014  . Diabetes type 2, uncontrolled 08/24/2013  . HTN (hypertension) 08/24/2013  . Hyperglycemia 08/24/2013    Sallyanne Kuster 03/02/2015, 11:54 PM  Sallyanne Kuster, PTA, [facility] [address] [place], [place] [address] [phone] 03/02/2015, 11:54 PM

## 2015-03-03 ENCOUNTER — Encounter: Payer: Self-pay | Admitting: Physical Therapy

## 2015-03-03 ENCOUNTER — Ambulatory Visit: Payer: Worker's Compensation | Admitting: Physical Therapy

## 2015-03-03 DIAGNOSIS — R269 Unspecified abnormalities of gait and mobility: Secondary | ICD-10-CM | POA: Diagnosis not present

## 2015-03-03 DIAGNOSIS — R2689 Other abnormalities of gait and mobility: Secondary | ICD-10-CM

## 2015-03-03 DIAGNOSIS — Z7409 Other reduced mobility: Secondary | ICD-10-CM

## 2015-03-03 DIAGNOSIS — Z89511 Acquired absence of right leg below knee: Secondary | ICD-10-CM

## 2015-03-03 NOTE — Therapy (Signed)
[facility] [address] [place], [place], [address] Phone: [phone]   Fax:  [phone]  Physical Therapy Treatment  Patient Details  Name: Marc Taylor MRN: [id number] Date of Birth: Jul 30, 1961 Referring Provider:  Blair Heys, MD  Encounter Date: 03/03/2015      PT End of Session - 03/03/15 1400    Visit Number 15   Number of Visits 18   Date for PT Re-Evaluation 03/14/15   PT Start Time 1400   PT Stop Time 1444   PT Time Calculation (min) 44 min   Equipment Utilized During Treatment Gait belt   Activity Tolerance Patient tolerated treatment well   Behavior During Therapy [name] for tasks assessed/performed      Past Medical History  Diagnosis Date  . Diabetes mellitus without complication   . Hypertension   . Type II diabetes mellitus     "just dx'd" (08/24/2013)  . History of blood transfusion 1963    "one; I had yellow jaundice" (08/24/2013)  . Compartment syndrome of right foot 09/04/2014  . Fracture of fifth toe, right, closed 09/04/2014  . Constipation     Past Surgical History  Procedure Laterality Date  . Fasciotomy Right 09/04/2014    Procedure: Right Foot FASCIOTOMY and wound vac placement;  Surgeon: [name], MD;  Location: [facility] OR;  Service: Orthopedics;  Laterality: Right;  . Open reduction internal fixation (orif) foot lisfranc fracture Right 09/04/2014    Procedure: OPEN REDUCTION INTERNAL FIXATION (ORIF) FOOT LISFRANC FRACTURE;  Surgeon: [name], MD;  [facility];  Service: Orthopedics;  Laterality: Right;  . I&d extremity Right 09/06/2014    Procedure: Irrigation and Debridement Right Foot, TheraSkin graft and wound vac change out;  Surgeon: [name], MD;  [facility];  Service: Orthopedics;  Laterality: Right;  . Amputation Right 09/18/2014    transmetatarsal         Dr [name]  . Amputation Right 09/18/2014    Procedure: Right Midfoot Amputation;  Surgeon:  [name], MD;  Location: [place] [facility] OR;  Service: Orthopedics;  Laterality: Right;  . Amputation Right 09/19/2014    Procedure: AMPUTATION BELOW KNEE RIGHT;  Surgeon: [name], MD;  [facility];  Service: Orthopedics;  Laterality: Right;  . Stump revision Right 11/01/2014    Procedure: Revision Below Knee Amputation;  Surgeon: [name], MD;  Location: [facility] OR;  Service: Orthopedics;  Laterality: Right;    There were no vitals filed for this visit.  Visit Diagnosis:  Abnormality of gait  Balance problems  Impaired functional mobility and activity tolerance  Status post below knee amputation of right lower extremity      Subjective Assessment - 03/03/15 1409    Subjective No new complaints. No falls.   Currently in Pain? No/denies                         [facility] Adult PT Treatment/Exercise - 03/03/15 1410    Transfers   Sit to Stand 6: Modified independent (Device/Increase time);With upper extremity assist;With armrests;From chair/3-in-1;Without upper extremity assist   Stand to Sit 6: Modified independent (Device/Increase time);With upper extremity assist;Without upper extremity assist;To chair/3-in-1;With armrests   Ambulation/Gait   Ambulation/Gait Yes   Ambulation/Gait Assistance 5: Supervision   Ambulation/Gait Assistance Details resistance belt to increase wt shift over prosthesis, cues on posture   Ambulation Distance (Feet) 1000 Feet  x 1   Assistive device  Prosthesis   Gait Pattern Step-through pattern;Decreased stance time - right;Decreased step length - left;Antalgic   Ambulation Surface Indoor;Level;Outdoor;Unlevel;Paved;Gravel;Grass   Stairs Yes   Stairs Assistance 5: Supervision;4: Min guard   Stairs Assistance Details (indicate cue type and reason) cues on knee control without rails   Stair Management Technique Two rails;One rail Left;One rail Right;No rails;Alternating pattern;Step to pattern;Forwards  reciprocal with rail(s), no rails  descend step-to   Number of Stairs 4  10 reps   Ramp 5: Supervision  prosthesis only   Curb 5: Supervision  prosthesis only   High Level Balance   High Level Balance Activities --   High Level Balance Comments --   Knee/Hip Exercises: Aerobic   Stationary Bike --   Elliptical --   Prosthetics   Prosthetic Care Comments  --   Current prosthetic wear tolerance (days/week)  7 days/wk   Current prosthetic wear tolerance (#hours/day)  all awake hours  drying as needed   Residual limb condition  intact   Education Provided Residual limb care   Person(s) Educated Patient   Education Method Explanation   Education Method Verbalized understanding     parallel bars: forward jumping bilateral stance. Resistance pulling with theraband.            PT Education - 03/03/15 1400    Education provided Yes   Education Details lifting technique   Person(s) Educated Patient   Methods Explanation;Demonstration   Comprehension Verbalized understanding;Returned demonstration;Need further instruction          PT Short Term Goals - 02/12/15 1241    PT SHORT TERM GOAL #1   Title Patient tolerates >8hours /day wear without skin issues or limb pain. (Target Date: 02/14/15)   Status Achieved   PT SHORT TERM GOAL #2   Title Patient verbalizes proper prosthetic care except cues to adjust ply socks. (Target Date: 02/14/15)   Status Achieved   PT SHORT TERM GOAL #3   Title Patient ambulates 400' with LRAD / prosthesis with cues only. (Target Date: 02/14/15)   Status Achieved   PT SHORT TERM GOAL #4   Title Patient negotiates ramp & curb with LRAD /prosthesis with cues only. (Target Date: 02/14/15)   Status Achieved   PT SHORT TERM GOAL #5   Title [name] Balance >36/56 (Target Date: 02/14/15)   Baseline 02/11/15: scored 53/54   Status Achieved           PT Long Term Goals - 01/16/15 1707    PT LONG TERM GOAL #1   Title Patient is independent with prosthetic care. (Target Date: 03/14/15)   Time  60   Period Days   Status New   PT LONG TERM GOAL #2   Title Patient tolerates wear >90% of awake hours without skin issues or limb pain. (Target Date: 03/14/15)   Time 60   Period Days   Status New   PT LONG TERM GOAL #3   Title patient reports no back pain with activities. (Target Date: 03/14/15)   Time 60   Period Days   Status New   PT LONG TERM GOAL #4   Title Patient ambulates >1000' including grass, ramps, curbs, stairs with prosthesis only independently. (Target Date: 03/14/15)   Time 60   Period Days   Status New   PT LONG TERM GOAL #5   Title Patient lifts & carries 30#, climbs ladders, push / pull carts with prosthesis safely. (Target Date: 03/14/15)   Time 60   Period Days  Status New               Plan - 03/03/15 1400    Clinical Impression Statement Patient improved ability to lift & carry up to 30#. Patient's gait improved with resistance to shift wt over prosthesis in stance.   Pt will benefit from skilled therapeutic intervention in order to improve on the following deficits Abnormal gait;Decreased activity tolerance;Decreased balance;Decreased endurance;Decreased knowledge of use of DME;Decreased mobility;Decreased strength;Pain  prosthetic dependency   Rehab Potential Good   PT Frequency 2x / week   PT Duration Other (comment)  9 weeks    PT Treatment/Interventions ADLs/Self Care Home Management;DME Instruction;Gait training;Stair training;Functional mobility training;Therapeutic activities;Therapeutic exercise;Balance training;Neuromuscular re-education;Patient/family education;Other (comment)  prsothetic training   PT Next Visit Plan continue with gait & balance, lifting /carrying, work on Advertising account executive with Plan of Care Patient        Problem List Patient Active Problem List   Diagnosis Date Noted  . Leg wound, right 10/23/2014  . Gas gangrene of foot 09/18/2014  . Compartment syndrome of right foot 09/04/2014  . Fracture of  fifth toe, right, closed 09/04/2014  . Compartment syndrome of foot 09/04/2014  . Diabetes type 2, uncontrolled 08/24/2013  . HTN (hypertension) 08/24/2013  . Hyperglycemia 08/24/2013    Marleny Faller PT, DPT 03/03/2015, 10:11 PM  [facility] [address] [place], [place], [address] Phone: [phone]   Fax:  [phone]

## 2015-03-05 ENCOUNTER — Encounter: Payer: Self-pay | Admitting: Physical Therapy

## 2015-03-07 ENCOUNTER — Ambulatory Visit: Payer: Worker's Compensation | Admitting: Physical Therapy

## 2015-03-10 ENCOUNTER — Encounter: Payer: Self-pay | Admitting: Physical Therapy

## 2015-03-10 ENCOUNTER — Ambulatory Visit: Payer: Worker's Compensation | Attending: Orthopedic Surgery | Admitting: Physical Therapy

## 2015-03-10 DIAGNOSIS — Z7409 Other reduced mobility: Secondary | ICD-10-CM | POA: Insufficient documentation

## 2015-03-10 DIAGNOSIS — R2689 Other abnormalities of gait and mobility: Secondary | ICD-10-CM

## 2015-03-10 DIAGNOSIS — R29818 Other symptoms and signs involving the nervous system: Secondary | ICD-10-CM | POA: Diagnosis not present

## 2015-03-10 DIAGNOSIS — Z89511 Acquired absence of right leg below knee: Secondary | ICD-10-CM | POA: Diagnosis not present

## 2015-03-10 DIAGNOSIS — M545 Low back pain: Secondary | ICD-10-CM | POA: Insufficient documentation

## 2015-03-10 DIAGNOSIS — R269 Unspecified abnormalities of gait and mobility: Secondary | ICD-10-CM | POA: Insufficient documentation

## 2015-03-10 NOTE — Therapy (Signed)
[place] [address] [place], [place], [address] Phone: [phone]   Fax:  [phone]  Physical Therapy Treatment  Patient Details  Name: Marc Taylor MRN: [id number] Date of Birth: Jun 22, 1961 Referring Provider:  Newt Minion, MD  Encounter Date: 03/10/2015      PT End of Session - 03/10/15 1400    Visit Number 16   Number of Visits 18   Date for PT Re-Evaluation 03/14/15   PT Start Time 1400   PT Stop Time 1445   PT Time Calculation (min) 45 min   Equipment Utilized During Treatment Gait belt   Activity Tolerance Patient tolerated treatment well   Behavior During Therapy [facility] for tasks assessed/performed      Past Medical History  Diagnosis Date  . Diabetes mellitus without complication   . Hypertension   . Type II diabetes mellitus     "just dx'd" (08/24/2013)  . History of blood transfusion 1963    "one; I had yellow jaundice" (08/24/2013)  . Compartment syndrome of right foot 09/04/2014  . Fracture of fifth toe, right, closed 09/04/2014  . Constipation     Past Surgical History  Procedure Laterality Date  . Fasciotomy Right 09/04/2014    Procedure: Right Foot FASCIOTOMY and wound vac placement;  Surgeon: Johnny Bridge, MD;  Location: [place];  Service: Orthopedics;  Laterality: Right;  . Open reduction internal fixation (orif) foot lisfranc fracture Right 09/04/2014    Procedure: OPEN REDUCTION INTERNAL FIXATION (ORIF) FOOT LISFRANC FRACTURE;  Surgeon: Johnny Bridge, MD;  Location: [place];  Service: Orthopedics;  Laterality: Right;  . I&d extremity Right 09/06/2014    Procedure: Irrigation and Debridement Right Foot, TheraSkin graft and wound vac change out;  Surgeon: Newt Minion, MD;  Location: [place];  Service: Orthopedics;  Laterality: Right;  . Amputation Right 09/18/2014    transmetatarsal         Dr Sharol Given  . Amputation Right 09/18/2014    Procedure: Right Midfoot Amputation;  Surgeon:  Newt Minion, MD;  Location: [place];  Service: Orthopedics;  Laterality: Right;  . Amputation Right 09/19/2014    Procedure: AMPUTATION BELOW KNEE RIGHT;  Surgeon: Newt Minion, MD;  Location: [place];  Service: Orthopedics;  Laterality: Right;  . Stump revision Right 11/01/2014    Procedure: Revision Below Knee Amputation;  Surgeon: Newt Minion, MD;  Location: [place];  Service: Orthopedics;  Laterality: Right;    There were no vitals filed for this visit.  Visit Diagnosis:  Abnormality of gait  Balance problems  Impaired functional mobility and activity tolerance  Status post below knee amputation of right lower extremity  Right low back pain, with sciatica presence unspecified      Subjective Assessment - 03/10/15 1406    Subjective Pt traveled out of town for family funeral, reports no issues or falls. Wearing prosthesis all awake hours. States he only has back pain after sitting long periods of time, but eases within 2 steps after standing.    Currently in Pain? No/denies      Patient ambulated with Independent Device: No device & prosthesis  Distance: 1300FT on level surfaces, gravel, grass, up and down grass hill, stepping over object, lifting and carrying up to 30  pound object.  Patient negotiated ramp with Independent Device: No device & prosthesis  Patient negotiated curb with Independent Device: No device & prosthesis  Patient negotiated stairs with Modified Independent Device: One handrail &  prosthesis reciprocal pattern       Patient negotiated ladder with supervision and cueing for sequencing and prosthesis placement on ladder using a step to pattern. PT demonstrated proper technique, demonstration repeated by pt.                           PT Education - 03/10/15 1653    Education provided Yes   Education Details Climbing ladder, swim prosthesis, potential activities with prosthesis   Person(s) Educated Patient   Methods  Explanation;Demonstration;Tactile cues   Comprehension Verbalized understanding;Returned demonstration;Need further instruction          PT Short Term Goals - 02/12/15 1241    PT SHORT TERM GOAL #1   Title Patient tolerates >8hours /day wear without skin issues or limb pain. (Target Date: 02/14/15)   Status Achieved   PT SHORT TERM GOAL #2   Title Patient verbalizes proper prosthetic care except cues to adjust ply socks. (Target Date: 02/14/15)   Status Achieved   PT SHORT TERM GOAL #3   Title Patient ambulates 400' with LRAD / prosthesis with cues only. (Target Date: 02/14/15)   Status Achieved   PT SHORT TERM GOAL #4   Title Patient negotiates ramp & curb with LRAD /prosthesis with cues only. (Target Date: 02/14/15)   Status Achieved   PT SHORT TERM GOAL #5   Title Merrilee Jansky Balance >36/56 (Target Date: 02/14/15)   Baseline 02/11/15: scored 53/54   Status Achieved           PT Long Term Goals - 03/10/15 1400    PT LONG TERM GOAL #1   Title Patient is independent with prosthetic care. (Target Date: 03/14/15)   Baseline MET 03/10/15   Time 60   Period Days   Status Achieved   PT LONG TERM GOAL #2   Title Patient tolerates wear >90% of awake hours without skin issues or limb pain. (Target Date: 03/14/15)   Baseline MET 03/10/15   Time 60   Period Days   Status Achieved   PT LONG TERM GOAL #3   Title patient reports no back pain with activities. (Target Date: 03/14/15)   Baseline MET 03/10/15   Time 60   Period Days   Status Achieved   PT LONG TERM GOAL #4   Title Patient ambulates >1000' including grass, ramps, curbs, stairs with prosthesis only independently. (Target Date: 03/14/15)   Baseline MET 03/10/15   Time 60   Period Days   Status Achieved   PT LONG TERM GOAL #5   Title Patient lifts & carries 30#, climbs ladders, push / pull carts with prosthesis safely. (Target Date: 03/14/15)   Time 60   Period Days   Status On-going               Plan - 03/10/15 1400     Clinical Impression Statement Pt met all but 1 LTG, will need further evaluation climbing ladders. Pt has dependency during higher function tasks that will require additional PT therefore PT plans to renew to address goals that will be updated next session.    Pt will benefit from skilled therapeutic intervention in order to improve on the following deficits Abnormal gait;Decreased activity tolerance;Decreased balance;Decreased endurance;Decreased knowledge of use of DME;Decreased mobility;Decreased strength;Pain  prosthetic dependency   Rehab Potential Good   PT Frequency 2x / week   PT Duration Other (comment)  9 weeks    PT Treatment/Interventions ADLs/Self Care Home Management;DME  Instruction;Gait training;Stair training;Functional mobility training;Therapeutic activities;Therapeutic exercise;Balance training;Neuromuscular re-education;Patient/family education;Other (comment)  prsothetic training   PT Next Visit Plan Assess remaining LTG, recert with updated LTGs, BERG, FGA, 69mgait speed   Consulted and Agree with Plan of Care Patient        Problem List Patient Active Problem List   Diagnosis Date Noted  . Leg wound, right 10/23/2014  . Gas gangrene of foot 09/18/2014  . Compartment syndrome of right foot 09/04/2014  . Fracture of fifth toe, right, closed 09/04/2014  . Compartment syndrome of foot 09/04/2014  . Diabetes type 2, uncontrolled 08/24/2013  . HTN (hypertension) 08/24/2013  . Hyperglycemia 08/24/2013    SArlana Hove SPT 03/10/2015, 5:01 PM  [id number] [address]Phone: [phone]  Fax:  [phone]

## 2015-03-12 ENCOUNTER — Ambulatory Visit: Payer: Worker's Compensation | Admitting: Physical Therapy

## 2015-03-12 ENCOUNTER — Encounter: Payer: Self-pay | Admitting: Physical Therapy

## 2015-03-12 DIAGNOSIS — R269 Unspecified abnormalities of gait and mobility: Secondary | ICD-10-CM

## 2015-03-12 DIAGNOSIS — R2689 Other abnormalities of gait and mobility: Secondary | ICD-10-CM

## 2015-03-12 DIAGNOSIS — Z7409 Other reduced mobility: Secondary | ICD-10-CM

## 2015-03-12 DIAGNOSIS — M545 Low back pain: Secondary | ICD-10-CM

## 2015-03-12 DIAGNOSIS — Z89511 Acquired absence of right leg below knee: Secondary | ICD-10-CM

## 2015-03-13 NOTE — Therapy (Addendum)
[facility] [address] [place], [place], [address] Phone: [phone]   Fax:  [phone]  Physical Therapy Treatment  Patient Details  Name: Marc Taylor MRN: [id number] Date of Birth: 1960-11-18 Referring Provider:  Gaynelle Arabian, MD  Encounter Date: 03/12/2015      PT End of Session - 03/18/15 0800    Visit Number 17   Number of Visits 25   Date for PT Re-Evaluation 04/11/15   Equipment Utilized During Treatment Gait belt   Activity Tolerance Patient tolerated treatment well   Behavior During Therapy [facility] for tasks assessed/performed      Past Medical History  Diagnosis Date  . Diabetes mellitus without complication   . Hypertension   . Type II diabetes mellitus     "just dx'd" (08/24/2013)  . History of blood transfusion 1963    "one; I had yellow jaundice" (08/24/2013)  . Compartment syndrome of right foot 09/04/2014  . Fracture of fifth toe, right, closed 09/04/2014  . Constipation     Past Surgical History  Procedure Laterality Date  . Fasciotomy Right 09/04/2014    Procedure: Right Foot FASCIOTOMY and wound vac placement;  Surgeon: Johnny Bridge, MD;  Location: [place];  Service: Orthopedics;  Laterality: Right;  . Open reduction internal fixation (orif) foot lisfranc fracture Right 09/04/2014    Procedure: OPEN REDUCTION INTERNAL FIXATION (ORIF) FOOT LISFRANC FRACTURE;  Surgeon: Johnny Bridge, MD;  Location: [place];  Service: Orthopedics;  Laterality: Right;  . I&d extremity Right 09/06/2014    Procedure: Irrigation and Debridement Right Foot, TheraSkin graft and wound vac change out;  Surgeon: Newt Minion, MD;  Location: [place];  Service: Orthopedics;  Laterality: Right;  . Amputation Right 09/18/2014    transmetatarsal         Dr Sharol Given  . Amputation Right 09/18/2014    Procedure: Right Midfoot Amputation;  Surgeon: Newt Minion, MD;  Location: [place];  Service: Orthopedics;  Laterality: Right;   . Amputation Right 09/19/2014    Procedure: AMPUTATION BELOW KNEE RIGHT;  Surgeon: Newt Minion, MD;  Location: [place];  Service: Orthopedics;  Laterality: Right;  . Stump revision Right 11/01/2014    Procedure: Revision Below Knee Amputation;  Surgeon: Newt Minion, MD;  Location: [place];  Service: Orthopedics;  Laterality: Right;    There were no vitals filed for this visit.  Visit Diagnosis:  Abnormality of gait  Balance problems  Impaired functional mobility and activity tolerance  Status post below knee amputation of right lower extremity  Right low back pain, with sciatica presence unspecified      Subjective Assessment - 03/17/15 1404    Subjective No new complaints. No falls to report. Back is "tweaking" after sitting in lobby.   Currently in Pain? Yes   Pain Score 5    Pain Location Back   Pain Orientation Lower   Pain Descriptors / Indicators Aching;Sore   Pain Type Chronic pain   Pain Onset More than a month ago   Pain Frequency Intermittent   Aggravating Factors  immobility   Pain Relieving Factors stretching/walking                 PT Short Term Goals - 02/12/15 1241    PT SHORT TERM GOAL #1   Title Patient tolerates >8hours /day wear without skin issues or limb pain. (Target Date: 02/14/15)   Status Achieved   PT SHORT TERM GOAL #2  Title Patient verbalizes proper prosthetic care except cues to adjust ply socks. (Target Date: 02/14/15)   Status Achieved   PT SHORT TERM GOAL #3   Title Patient ambulates 400' with LRAD / prosthesis with cues only. (Target Date: 02/14/15)   Status Achieved   PT SHORT TERM GOAL #4   Title Patient negotiates ramp & curb with LRAD /prosthesis with cues only. (Target Date: 02/14/15)   Status Achieved   PT SHORT TERM GOAL #5   Title Merrilee Jansky Balance >36/56 (Target Date: 02/14/15)   Baseline 02/11/15: scored 53/54   Status Achieved           PT Long Term Goals - 03/18/15 8466    PT LONG TERM GOAL #1   Title Patient is  independent with prosthetic care. (Target Date: 03/14/15)   Baseline MET 03/10/15   Time 60   Period Days   Status Achieved   PT LONG TERM GOAL #2   Title Patient tolerates wear >90% of awake hours without skin issues or limb pain. (Target Date: 03/14/15)   Baseline MET 03/10/15   Time 60   Period Days   Status Achieved   PT LONG TERM GOAL #3   Title patient reports no back pain with activities. (Target Date: 03/14/15)   Baseline MET 03/10/15   Time 60   Period Days   Status Achieved   PT LONG TERM GOAL #4   Title Patient ambulates >1000' including grass, ramps, curbs, stairs with prosthesis only independently. (Target Date: 03/14/15)   Baseline MET 03/10/15   Time 60   Period Days   Status Achieved   PT LONG TERM GOAL #5   Title Patient lifts & carries 30#, climbs ladders, push / pull carts with prosthesis safely. (NEW Target Date: 04/11/15)   Time 4   Period Weeks   Status On-going   Additional Long Term Goals   Additional Long Term Goals Yes   PT LONG TERM GOAL #6   Title demonstrates riding bicycle including mounting / dismounting, start / stop and turning safely. (Target Date: 04/11/2015)   Time 4   Period Weeks   Status New   PT LONG TERM GOAL #7   Title jogs 300' independently. (Target Date: 04/11/2015)   Time 4   Period Weeks   Status New   PT LONG TERM GOAL #8   Title ambulates >2000' on various surfaces with back pain increasing <2 increments. (Target Date: 04/11/2015)   Time 4   Period Weeks   Status New           Plan - 03/17/15 1501    Clinical Impression Statement Pt continues to be challenged by higher level balance activities. Progressing toward goals.   Pt will benefit from skilled therapeutic intervention in order to improve on the following deficits Decreased activity tolerance;Decreased balance;Decreased endurance;Decreased knowledge of use of DME;Decreased mobility;Decreased strength;Pain;Abnormal gait  prosthetic dependency   Rehab Potential Good   PT  Frequency 2x / week   PT Duration Other (comment)  9 weeks    PT Treatment/Interventions ADLs/Self Care Home Management;DME Instruction;Gait training;Stair training;Functional mobility training;Therapeutic activities;Therapeutic exercise;Balance training;Neuromuscular re-education;Patient/family education;Other (comment)  prsothetic training   PT Next Visit Plan continue toward goals pending approval of additional visits from worker's comp   Consulted and Agree with Plan of Care Patient        Problem List Patient Active Problem List   Diagnosis Date Noted  . Leg wound, right 10/23/2014  . Gas gangrene of foot  09/18/2014  . Compartment syndrome of right foot 09/04/2014  . Fracture of fifth toe, right, closed 09/04/2014  . Compartment syndrome of foot 09/04/2014  . Diabetes type 2, uncontrolled 08/24/2013  . HTN (hypertension) 08/24/2013  . Hyperglycemia 08/24/2013    Carlynn Purl, DPT Physical Therapist Specializing in Prosthetics 03/18/2015, 8:31 AM  Willow Ora, PTA, [facility] [address], [place], [place] [address] [phone] 03/18/2015, 8:31 AM

## 2015-03-14 ENCOUNTER — Encounter: Payer: Self-pay | Admitting: Physical Therapy

## 2015-03-17 ENCOUNTER — Encounter: Payer: Self-pay | Admitting: Physical Therapy

## 2015-03-17 ENCOUNTER — Ambulatory Visit: Payer: Worker's Compensation | Attending: Orthopedic Surgery | Admitting: Physical Therapy

## 2015-03-17 DIAGNOSIS — Z7409 Other reduced mobility: Secondary | ICD-10-CM

## 2015-03-17 DIAGNOSIS — Z89511 Acquired absence of right leg below knee: Secondary | ICD-10-CM | POA: Insufficient documentation

## 2015-03-17 DIAGNOSIS — R29818 Other symptoms and signs involving the nervous system: Secondary | ICD-10-CM | POA: Insufficient documentation

## 2015-03-17 DIAGNOSIS — M545 Low back pain: Secondary | ICD-10-CM | POA: Diagnosis not present

## 2015-03-17 DIAGNOSIS — R269 Unspecified abnormalities of gait and mobility: Secondary | ICD-10-CM

## 2015-03-17 DIAGNOSIS — R2689 Other abnormalities of gait and mobility: Secondary | ICD-10-CM

## 2015-03-17 NOTE — Therapy (Signed)
[facility] [address] [place], [place], [address] Phone: [phone]   Fax:  [phone]  Physical Therapy Treatment  Patient Details  Name: Marc Taylor MRN: [id number] Date of Birth: Aug 15, 1961 Referring Provider:  Gaynelle Arabian, MD  Encounter Date: 03/17/2015      PT End of Session - 03/17/15 1500    Visit Number 18   Number of Visits 18   Date for PT Re-Evaluation 03/14/15   PT Start Time 1402   PT Stop Time 1445   PT Time Calculation (min) 43 min   Equipment Utilized During Treatment Gait belt   Activity Tolerance Patient tolerated treatment well   Behavior During Therapy [facility] for tasks assessed/performed      Past Medical History  Diagnosis Date  . Diabetes mellitus without complication   . Hypertension   . Type II diabetes mellitus     "just dx'd" (08/24/2013)  . History of blood transfusion 1963    "one; I had yellow jaundice" (08/24/2013)  . Compartment syndrome of right foot 09/04/2014  . Fracture of fifth toe, right, closed 09/04/2014  . Constipation     Past Surgical History  Procedure Laterality Date  . Fasciotomy Right 09/04/2014    Procedure: Right Foot FASCIOTOMY and wound vac placement;  Surgeon: Johnny Bridge, MD;  Location: [facility];  Service: Orthopedics;  Laterality: Right;  . Open reduction internal fixation (orif) foot lisfranc fracture Right 09/04/2014    Procedure: OPEN REDUCTION INTERNAL FIXATION (ORIF) FOOT LISFRANC FRACTURE;  Surgeon: Johnny Bridge, MD;  Location: [place];  Service: Orthopedics;  Laterality: Right;  . I&d extremity Right 09/06/2014    Procedure: Irrigation and Debridement Right Foot, TheraSkin graft and wound vac change out;  Surgeon: Newt Minion, MD;  Location: [place];  Service: Orthopedics;  Laterality: Right;  . Amputation Right 09/18/2014    transmetatarsal         Dr Sharol Given  . Amputation Right 09/18/2014    Procedure: Right Midfoot Amputation;  Surgeon:  Newt Minion, MD;  Location: [place];  Service: Orthopedics;  Laterality: Right;  . Amputation Right 09/19/2014    Procedure: AMPUTATION BELOW KNEE RIGHT;  Surgeon: Newt Minion, MD;  Location: [place];  Service: Orthopedics;  Laterality: Right;  . Stump revision Right 11/01/2014    Procedure: Revision Below Knee Amputation;  Surgeon: Newt Minion, MD;  Location: [place];  Service: Orthopedics;  Laterality: Right;    There were no vitals filed for this visit.  Visit Diagnosis:  Abnormality of gait  Balance problems  Impaired functional mobility and activity tolerance      Subjective Assessment - 03/17/15 1404    Subjective No new complaints. No falls to report. Back is "tweaking" after sitting in lobby.   Currently in Pain? Yes   Pain Score 5    Pain Location Back   Pain Orientation Lower   Pain Descriptors / Indicators Aching;Sore   Pain Type Chronic pain   Pain Onset More than a month ago   Pain Frequency Intermittent   Aggravating Factors  immobility   Pain Relieving Factors stretching/walking          [facility] Adult PT Treatment/Exercise - 03/17/15 0001    Ambulation/Gait   Ambulation/Gait Yes   Ambulation/Gait Assistance 5: Supervision;4: Min assist   Ambulation/Gait Assistance Details supervision on paved surfaces with cues on prosthetic positon with gait; up to min assist needed with negotiating up/down grassy hills x 3  reps with cues on techique/step length.                               Ambulation Distance (Feet) 700 Feet   Assistive device Prosthesis   Gait Pattern Step-through pattern;Decreased stance time - right;Decreased step length - left;Antalgic   Ambulation Surface Level;Indoor;Outdoor;Paved;Gravel   Dynamic Standing Balance   Dynamic Standing - Balance Support No upper extremity supported;During functional activity   Dynamic Standing - Level of Assistance 4: Min assist   Dynamic Standing - Balance Activities Foam balance beam;Alternating  foot traps   Dynamic  Standing - Comments on foam beam: side stepping left/right and tandem gait forward/backward x 3 laps each;standing with feet across foam beam: alternating forward heel taps and backward toe taps x 10 each. with tall cones next to mat: alternating forward taps, cross taps, forward double taps and cross double taps x 10 each bil legs; on inverted BOSU: forward/backward rocks, lateral rocks and mini squats x 10 each. cues on posture and weight shifting with each of these activities. min HHA needed with single leg stance with cones.                                    Prosthetics   Current prosthetic wear tolerance (days/week)  7 days/wk   Current prosthetic wear tolerance (#hours/day)  all awake hours  drying as needed   Residual limb condition  intact   Education Provided Residual limb care;Correct ply sock adjustment   Person(s) Educated Patient   Education Method Explanation   Education Method Verbalized understanding   Donning Prosthesis Modified independent (device/increased time)   Doffing Prosthesis Modified independent (device/increased time)           PT Short Term Goals - 02/12/15 1241    PT SHORT TERM GOAL #1   Title Patient tolerates >8hours /day wear without skin issues or limb pain. (Target Date: 02/14/15)   Status Achieved   PT SHORT TERM GOAL #2   Title Patient verbalizes proper prosthetic care except cues to adjust ply socks. (Target Date: 02/14/15)   Status Achieved   PT SHORT TERM GOAL #3   Title Patient ambulates 400' with LRAD / prosthesis with cues only. (Target Date: 02/14/15)   Status Achieved   PT SHORT TERM GOAL #4   Title Patient negotiates ramp & curb with LRAD /prosthesis with cues only. (Target Date: 02/14/15)   Status Achieved   PT SHORT TERM GOAL #5   Title Merrilee Jansky Balance >36/56 (Target Date: 02/14/15)   Baseline 02/11/15: scored 53/54   Status Achieved           PT Long Term Goals - 03/10/15 1400    PT LONG TERM GOAL #1   Title Patient is independent with  prosthetic care. (Target Date: 03/14/15)   Baseline MET 03/10/15   Time 60   Period Days   Status Achieved   PT LONG TERM GOAL #2   Title Patient tolerates wear >90% of awake hours without skin issues or limb pain. (Target Date: 03/14/15)   Baseline MET 03/10/15   Time 60   Period Days   Status Achieved   PT LONG TERM GOAL #3   Title patient reports no back pain with activities. (Target Date: 03/14/15)   Baseline MET 03/10/15   Time 60   Period Days   Status Achieved  PT LONG TERM GOAL #4   Title Patient ambulates >1000' including grass, ramps, curbs, stairs with prosthesis only independently. (Target Date: 03/14/15)   Baseline MET 03/10/15   Time 60   Period Days   Status Achieved   PT LONG TERM GOAL #5   Title Patient lifts & carries 30#, climbs ladders, push / pull carts with prosthesis safely. (Target Date: 03/14/15)   Time 60   Period Days   Status On-going           Plan - 03/17/15 1501    Clinical Impression Statement Pt continues to be challenged by higher level balance activities. Progressing toward goals.   Pt will benefit from skilled therapeutic intervention in order to improve on the following deficits Decreased activity tolerance;Decreased balance;Decreased endurance;Decreased knowledge of use of DME;Decreased mobility;Decreased strength;Pain;Abnormal gait  prosthetic dependency   Rehab Potential Good   PT Frequency 2x / week   PT Duration Other (comment)  9 weeks    PT Treatment/Interventions ADLs/Self Care Home Management;DME Instruction;Gait training;Stair training;Functional mobility training;Therapeutic activities;Therapeutic exercise;Balance training;Neuromuscular re-education;Patient/family education;Other (comment)  prsothetic training   PT Next Visit Plan continue toward goals pending approval of additional visits from worker's comp   Consulted and Agree with Plan of Care Patient        Problem List Patient Active Problem List   Diagnosis Date Noted   . Leg wound, right 10/23/2014  . Gas gangrene of foot 09/18/2014  . Compartment syndrome of right foot 09/04/2014  . Fracture of fifth toe, right, closed 09/04/2014  . Compartment syndrome of foot 09/04/2014  . Diabetes type 2, uncontrolled 08/24/2013  . HTN (hypertension) 08/24/2013  . Hyperglycemia 08/24/2013    Willow Ora 03/17/2015, 3:02 PM  Willow Ora, PTA, [facility] [address] [place], [place] [address] [phone] 03/17/2015, 3:02 PM

## 2015-03-18 NOTE — Addendum Note (Signed)
Addended by: Fraser DinWALDRON, Miraj Truss M on: 03/18/2015 08:35 AM   Modules accepted: Orders

## 2015-03-19 ENCOUNTER — Encounter: Payer: Self-pay | Admitting: Physical Therapy

## 2015-03-19 ENCOUNTER — Ambulatory Visit: Payer: Worker's Compensation | Admitting: Physical Therapy

## 2015-03-19 DIAGNOSIS — Z7409 Other reduced mobility: Secondary | ICD-10-CM

## 2015-03-19 DIAGNOSIS — R269 Unspecified abnormalities of gait and mobility: Secondary | ICD-10-CM

## 2015-03-19 DIAGNOSIS — R2689 Other abnormalities of gait and mobility: Secondary | ICD-10-CM

## 2015-03-20 NOTE — Therapy (Signed)
[facility] [address] [place], [place], [address] Phone: [phone]   Fax:  [phone]  Physical Therapy Treatment  Patient Details  Name: Marc Taylor MRN: [id number] Date of Birth: July 13, 1961 Referring Provider:  Gaynelle Arabian, MD  Encounter Date: 03/19/2015      PT End of Session - 03/19/15 1450    Visit Number 19   Number of Visits 25   Date for PT Re-Evaluation 04/11/15   PT Start Time 1446   PT Stop Time 1530   PT Time Calculation (min) 44 min   Equipment Utilized During Treatment Gait belt   Activity Tolerance Patient tolerated treatment well   Behavior During Therapy [facility] for tasks assessed/performed      Past Medical History  Diagnosis Date  . Diabetes mellitus without complication   . Hypertension   . Type II diabetes mellitus     "just dx'd" (08/24/2013)  . History of blood transfusion 1963    "one; I had yellow jaundice" (08/24/2013)  . Compartment syndrome of right foot 09/04/2014  . Fracture of fifth toe, right, closed 09/04/2014  . Constipation     Past Surgical History  Procedure Laterality Date  . Fasciotomy Right 09/04/2014    Procedure: Right Foot FASCIOTOMY and wound vac placement;  Surgeon: Johnny Bridge, MD;  Location: [facility];  Service: Orthopedics;  Laterality: Right;  . Open reduction internal fixation (orif) foot lisfranc fracture Right 09/04/2014    Procedure: OPEN REDUCTION INTERNAL FIXATION (ORIF) FOOT LISFRANC FRACTURE;  Surgeon: Johnny Bridge, MD;  Location: [facility];  Service: Orthopedics;  Laterality: Right;  . I&d extremity Right 09/06/2014    Procedure: Irrigation and Debridement Right Foot, TheraSkin graft and wound vac change out;  Surgeon: Newt Minion, MD;  Location: [facility];  Service: Orthopedics;  Laterality: Right;  . Amputation Right 09/18/2014    transmetatarsal         Dr Sharol Given  . Amputation Right 09/18/2014    Procedure: Right Midfoot Amputation;  Surgeon:  Newt Minion, MD;  Location: [place];  Service: Orthopedics;  Laterality: Right;  . Amputation Right 09/19/2014    Procedure: AMPUTATION BELOW KNEE RIGHT;  Surgeon: Newt Minion, MD;  Location: [place];  Service: Orthopedics;  Laterality: Right;  . Stump revision Right 11/01/2014    Procedure: Revision Below Knee Amputation;  Surgeon: Newt Minion, MD;  Location: [place];  Service: Orthopedics;  Laterality: Right;    There were no vitals filed for this visit.  Visit Diagnosis:  Abnormality of gait  Balance problems  Impaired functional mobility and activity tolerance      Subjective Assessment - 03/19/15 1449    Subjective No new complaints. No falls to report.    Currently in Pain? No/denies   Pain Score 0-No pain           [facility] Adult PT Treatment/Exercise - 03/19/15 1527    Ambulation/Gait   Ambulation/Gait Yes   Ambulation/Gait Assistance 5: Supervision   Ambulation Distance (Feet) 2000 Feet   Assistive device Prosthesis   Gait Pattern Step-through pattern;Decreased stance time - right;Decreased step length - left;Antalgic   Ambulation Surface Level;Unlevel;Indoor;Outdoor;Paved   Stairs Yes   Stairs Assistance 5: Supervision;4: Min guard   Stairs Assistance Details (indicate cue type and reason) cues on weight shift and toe placement with descending   Stair Management Technique No rails;Alternating pattern;Forwards   Number of Stairs 4  x 4 reps   Dynamic Standing  Balance   Dynamic Standing - Balance Support No upper extremity supported;During functional activity   Dynamic Standing - Balance Activities Foam balance beam;Other (comment)  red mat over scattered foam bubbles   High Level Balance   High Level Balance Activities Tandem walking;Backward walking;Side stepping     in parallel bars:  Red mat over foam bubbles: forward/backward walking over mat x 4 laps with intermittent UE support on bars.  Blue foam beam: side stepping left/right x 3 laps each way, tandem  walking forward/backward x 3 laps with intermittent UE assist on bars. With feet across beam: alternating forward taps x 10 and backward taps x 10 each side.        PT Short Term Goals - 02/12/15 1241    PT SHORT TERM GOAL #1   Title Patient tolerates >8hours /day wear without skin issues or limb pain. (Target Date: 02/14/15)   Status Achieved   PT SHORT TERM GOAL #2   Title Patient verbalizes proper prosthetic care except cues to adjust ply socks. (Target Date: 02/14/15)   Status Achieved   PT SHORT TERM GOAL #3   Title Patient ambulates 400' with LRAD / prosthesis with cues only. (Target Date: 02/14/15)   Status Achieved   PT SHORT TERM GOAL #4   Title Patient negotiates ramp & curb with LRAD /prosthesis with cues only. (Target Date: 02/14/15)   Status Achieved   PT SHORT TERM GOAL #5   Title Merrilee Jansky Balance >36/56 (Target Date: 02/14/15)   Baseline 02/11/15: scored 53/54   Status Achieved           PT Long Term Goals - 03/18/15 3382    PT LONG TERM GOAL #1   Title Patient is independent with prosthetic care. (Target Date: 03/14/15)   Baseline MET 03/10/15   Time 60   Period Days   Status Achieved   PT LONG TERM GOAL #2   Title Patient tolerates wear >90% of awake hours without skin issues or limb pain. (Target Date: 03/14/15)   Baseline MET 03/10/15   Time 60   Period Days   Status Achieved   PT LONG TERM GOAL #3   Title patient reports no back pain with activities. (Target Date: 03/14/15)   Baseline MET 03/10/15   Time 60   Period Days   Status Achieved   PT LONG TERM GOAL #4   Title Patient ambulates >1000' including grass, ramps, curbs, stairs with prosthesis only independently. (Target Date: 03/14/15)   Baseline MET 03/10/15   Time 60   Period Days   Status Achieved   PT LONG TERM GOAL #5   Title Patient lifts & carries 30#, climbs ladders, push / pull carts with prosthesis safely. (NEW Target Date: 04/11/15)   Time 4   Period Weeks   Status On-going   Additional Long Term  Goals   Additional Long Term Goals Yes   PT LONG TERM GOAL #6   Title demonstrates riding bicycle including mounting / dismounting, start / stop and turning safely. (Target Date: 04/11/2015)   Time 4   Period Weeks   Status New   PT LONG TERM GOAL #7   Title jogs 300' independently. (Target Date: 04/11/2015)   Time 4   Period Weeks   Status New   PT LONG TERM GOAL #8   Title ambulates >2000' on various surfaces with back pain increasing <2 increments. (Target Date: 04/11/2015)   Time 4   Period Weeks   Status New  Plan - 03/19/15 1451    Clinical Impression Statement Pt is making steady progress toward goals.   Pt will benefit from skilled therapeutic intervention in order to improve on the following deficits Decreased activity tolerance;Decreased balance;Decreased endurance;Decreased knowledge of use of DME;Decreased mobility;Decreased strength;Pain;Abnormal gait  prosthetic dependency   Rehab Potential Good   PT Frequency 2x / week   PT Duration Other (comment)  9 weeks    PT Treatment/Interventions ADLs/Self Care Home Management;DME Instruction;Gait training;Stair training;Functional mobility training;Therapeutic activities;Therapeutic exercise;Balance training;Neuromuscular re-education;Patient/family education;Other (comment)  prsothetic training   PT Next Visit Plan Continue to work on high level balance and strengthening.   Consulted and Agree with Plan of Care Patient        Problem List Patient Active Problem List   Diagnosis Date Noted  . Leg wound, right 10/23/2014  . Gas gangrene of foot 09/18/2014  . Compartment syndrome of right foot 09/04/2014  . Fracture of fifth toe, right, closed 09/04/2014  . Compartment syndrome of foot 09/04/2014  . Diabetes type 2, uncontrolled 08/24/2013  . HTN (hypertension) 08/24/2013  . Hyperglycemia 08/24/2013    Willow Ora 03/20/2015, 1:59 PM  Willow Ora, PTA, [facility] [address], [place], [place] [address] [phone] 03/20/2015, 1:59 PM

## 2015-03-21 ENCOUNTER — Encounter: Payer: Self-pay | Admitting: Physical Therapy

## 2015-03-25 ENCOUNTER — Encounter: Payer: Self-pay | Admitting: Physical Therapy

## 2015-03-25 ENCOUNTER — Ambulatory Visit: Payer: Worker's Compensation | Attending: Family Medicine | Admitting: Physical Therapy

## 2015-03-25 DIAGNOSIS — R29818 Other symptoms and signs involving the nervous system: Secondary | ICD-10-CM | POA: Insufficient documentation

## 2015-03-25 DIAGNOSIS — R269 Unspecified abnormalities of gait and mobility: Secondary | ICD-10-CM | POA: Diagnosis not present

## 2015-03-25 DIAGNOSIS — Z89511 Acquired absence of right leg below knee: Secondary | ICD-10-CM

## 2015-03-25 DIAGNOSIS — Z7409 Other reduced mobility: Secondary | ICD-10-CM | POA: Diagnosis not present

## 2015-03-25 DIAGNOSIS — M545 Low back pain: Secondary | ICD-10-CM | POA: Diagnosis not present

## 2015-03-25 DIAGNOSIS — R2689 Other abnormalities of gait and mobility: Secondary | ICD-10-CM

## 2015-03-25 NOTE — Therapy (Signed)
[place] [facility] [address] [place], [place], [address] Phone: [phone]   Fax:  [phone]  Physical Therapy Treatment  Patient Details  Name: Marc Taylor MRN: [id number] Date of Birth: 17-Jun-1961 Referring Provider:  Gaynelle Arabian, MD  Encounter Date: 03/25/2015      PT End of Session - 03/25/15 1311    Visit Number 20   Number of Visits 25   Date for PT Re-Evaluation 04/11/15   PT Start Time 1311   PT Stop Time 1400   PT Time Calculation (min) 49 min   Equipment Utilized During Treatment Gait belt   Activity Tolerance Patient tolerated treatment well   Behavior During Therapy [facility] for tasks assessed/performed      Past Medical History  Diagnosis Date  . Diabetes mellitus without complication   . Hypertension   . Type II diabetes mellitus     "just dx'd" (08/24/2013)  . History of blood transfusion 1963    "one; I had yellow jaundice" (08/24/2013)  . Compartment syndrome of right foot 09/04/2014  . Fracture of fifth toe, right, closed 09/04/2014  . Constipation     Past Surgical History  Procedure Laterality Date  . Fasciotomy Right 09/04/2014    Procedure: Right Foot FASCIOTOMY and wound vac placement;  Surgeon: Johnny Bridge, MD;  Location: [place];  Service: Orthopedics;  Laterality: Right;  . Open reduction internal fixation (orif) foot lisfranc fracture Right 09/04/2014    Procedure: OPEN REDUCTION INTERNAL FIXATION (ORIF) FOOT LISFRANC FRACTURE;  Surgeon: Johnny Bridge, MD;  Location: [place];  Service: Orthopedics;  Laterality: Right;  . I&d extremity Right 09/06/2014    Procedure: Irrigation and Debridement Right Foot, TheraSkin graft and wound vac change out;  Surgeon: Newt Minion, MD;  Location: [place];  Service: Orthopedics;  Laterality: Right;  . Amputation Right 09/18/2014    transmetatarsal         Dr Sharol Given  . Amputation Right 09/18/2014    Procedure: Right Midfoot Amputation;  Surgeon:  Newt Minion, MD;  Location: [place];  Service: Orthopedics;  Laterality: Right;  . Amputation Right 09/19/2014    Procedure: AMPUTATION BELOW KNEE RIGHT;  Surgeon: Newt Minion, MD;  Location: [place];  Service: Orthopedics;  Laterality: Right;  . Stump revision Right 11/01/2014    Procedure: Revision Below Knee Amputation;  Surgeon: Newt Minion, MD;  Location: [place];  Service: Orthopedics;  Laterality: Right;    There were no vitals filed for this visit.  Visit Diagnosis:  Abnormality of gait  Balance problems  Impaired functional mobility and activity tolerance  Status post below knee amputation of right lower extremity  Right low back pain, with sciatica presence unspecified      Subjective Assessment - 03/25/15 1313    Subjective Pt states he has been going to the [facility] and using the leg press and elliptical machines. No new issues at this time.    Currently in Pain? Yes   Pain Score 6    Pain Location Back   Pain Orientation Lower   Pain Descriptors / Indicators Shooting   Pain Type Acute pain   Pain Onset Yesterday   Pain Relieving Factors medication       Prosthetic Training: Patient negotiated stairs with Modified Independent Device: One handrail and no handrails x4  & prosthesis reciprocal pattern Patient able to jog/run 25' x10 with visual cues for stride length and flight phase.  Balance Activities: Dribbling basketball  150' while walking. Able to turn and change directions in defensive position while dribbling large therapy ball.  Foot movement pattern for turning 180* in order to change directions during gait - performed with no device, then performed while carrying 15# box.  Lift, carry 10', turn, lower 30# box x10 reps.  Ascend and descend ladder x2 with verbal cues In parallel bars, performed side/cross step with one foot crossing in front of the other, both directions.                            PT Education - 03/25/15 1311     Education provided Yes   Education Details Step and stride length for jogging/running. Negotiating a ladder and proper step sequence, balance techniques.    Person(s) Educated Patient   Methods Explanation;Demonstration   Comprehension Verbalized understanding;Returned demonstration          PT Short Term Goals - 02/12/15 1241    PT SHORT TERM GOAL #1   Title Patient tolerates >8hours /day wear without skin issues or limb pain. (Target Date: 02/14/15)   Status Achieved   PT SHORT TERM GOAL #2   Title Patient verbalizes proper prosthetic care except cues to adjust ply socks. (Target Date: 02/14/15)   Status Achieved   PT SHORT TERM GOAL #3   Title Patient ambulates 400' with LRAD / prosthesis with cues only. (Target Date: 02/14/15)   Status Achieved   PT SHORT TERM GOAL #4   Title Patient negotiates ramp & curb with LRAD /prosthesis with cues only. (Target Date: 02/14/15)   Status Achieved   PT SHORT TERM GOAL #5   Title Merrilee Jansky Balance >36/56 (Target Date: 02/14/15)   Baseline 02/11/15: scored 53/54   Status Achieved           PT Long Term Goals - 03/18/15 9735    PT LONG TERM GOAL #1   Title Patient is independent with prosthetic care. (Target Date: 03/14/15)   Baseline MET 03/10/15   Time 60   Period Days   Status Achieved   PT LONG TERM GOAL #2   Title Patient tolerates wear >90% of awake hours without skin issues or limb pain. (Target Date: 03/14/15)   Baseline MET 03/10/15   Time 60   Period Days   Status Achieved   PT LONG TERM GOAL #3   Title patient reports no back pain with activities. (Target Date: 03/14/15)   Baseline MET 03/10/15   Time 60   Period Days   Status Achieved   PT LONG TERM GOAL #4   Title Patient ambulates >1000' including grass, ramps, curbs, stairs with prosthesis only independently. (Target Date: 03/14/15)   Baseline MET 03/10/15   Time 60   Period Days   Status Achieved   PT LONG TERM GOAL #5   Title Patient lifts & carries 30#, climbs ladders, push /  pull carts with prosthesis safely. (NEW Target Date: 04/11/15)   Time 4   Period Weeks   Status On-going   Additional Long Term Goals   Additional Long Term Goals Yes   PT LONG TERM GOAL #6   Title demonstrates riding bicycle including mounting / dismounting, start / stop and turning safely. (Target Date: 04/11/2015)   Time 4   Period Weeks   Status New   PT LONG TERM GOAL #7   Title jogs 300' independently. (Target Date: 04/11/2015)   Time 4   Period Weeks  Status New   PT LONG TERM GOAL #8   Title ambulates >2000' on various surfaces with back pain increasing <2 increments. (Target Date: 04/11/2015)   Time 4   Period Weeks   Status New               Plan - 03/25/15 1311    Clinical Impression Statement Pt's balance is continuing to improve.    Pt will benefit from skilled therapeutic intervention in order to improve on the following deficits Decreased activity tolerance;Decreased balance;Decreased endurance;Decreased knowledge of use of DME;Decreased mobility;Decreased strength;Pain;Abnormal gait  prosthetic dependency   Rehab Potential Good   PT Frequency 2x / week   PT Duration Other (comment)  9 weeks    PT Treatment/Interventions ADLs/Self Care Home Management;DME Instruction;Gait training;Stair training;Functional mobility training;Therapeutic activities;Therapeutic exercise;Balance training;Neuromuscular re-education;Patient/family education;Other (comment)  prsothetic training   PT Next Visit Plan Continue to work on high level balance and strengthening.    Consulted and Agree with Plan of Care Patient        Problem List Patient Active Problem List   Diagnosis Date Noted  . Leg wound, right 10/23/2014  . Gas gangrene of foot 09/18/2014  . Compartment syndrome of right foot 09/04/2014  . Fracture of fifth toe, right, closed 09/04/2014  . Compartment syndrome of foot 09/04/2014  . Diabetes type 2, uncontrolled 08/24/2013  . HTN (hypertension) 08/24/2013   . Hyperglycemia 08/24/2013   Jamey Reas, PT, DPT PT Specializing in [place] 03/25/2015 5:02 PM Phone:  [phone]  Fax:  [phone] [place] [address] [place], [place] [address] [place], [place] 03/25/2015, 5:00 PM  [place] [address] [place], [place], [address] Phone: [phone]   Fax:  [phone]

## 2015-03-27 ENCOUNTER — Encounter: Payer: Self-pay | Admitting: Physical Therapy

## 2015-03-27 ENCOUNTER — Ambulatory Visit: Payer: Worker's Compensation | Admitting: Physical Therapy

## 2015-03-27 DIAGNOSIS — R2689 Other abnormalities of gait and mobility: Secondary | ICD-10-CM

## 2015-03-27 DIAGNOSIS — R269 Unspecified abnormalities of gait and mobility: Secondary | ICD-10-CM

## 2015-03-27 DIAGNOSIS — Z89511 Acquired absence of right leg below knee: Secondary | ICD-10-CM

## 2015-03-27 DIAGNOSIS — Z7409 Other reduced mobility: Secondary | ICD-10-CM

## 2015-03-27 NOTE — Therapy (Signed)
[facility] [address] [place], [place], [address] Phone: [phone]   Fax:  [phone]  Physical Therapy Treatment  Patient Details  Name: Marc Taylor MRN: [id number] Date of Birth: 03-07-1961 Referring Provider:  Gaynelle Arabian, MD  Encounter Date: 03/27/2015      PT End of Session - 03/27/15 1106    Visit Number 21   Number of Visits 25   Date for PT Re-Evaluation 04/11/15   PT Start Time 1106   PT Stop Time 1149   PT Time Calculation (min) 43 min   Equipment Utilized During Treatment Gait belt   Activity Tolerance Patient tolerated treatment well   Behavior During Therapy [facility] for tasks assessed/performed      Past Medical History  Diagnosis Date  . Diabetes mellitus without complication   . Hypertension   . Type II diabetes mellitus     "just dx'd" (08/24/2013)  . History of blood transfusion 1963    "one; I had yellow jaundice" (08/24/2013)  . Compartment syndrome of right foot 09/04/2014  . Fracture of fifth toe, right, closed 09/04/2014  . Constipation     Past Surgical History  Procedure Laterality Date  . Fasciotomy Right 09/04/2014    Procedure: Right Foot FASCIOTOMY and wound vac placement;  Surgeon: Johnny Bridge, MD;  Location: [place];  Service: Orthopedics;  Laterality: Right;  . Open reduction internal fixation (orif) foot lisfranc fracture Right 09/04/2014    Procedure: OPEN REDUCTION INTERNAL FIXATION (ORIF) FOOT LISFRANC FRACTURE;  Surgeon: Johnny Bridge, MD;  Location: [place];  Service: Orthopedics;  Laterality: Right;  . I&d extremity Right 09/06/2014    Procedure: Irrigation and Debridement Right Foot, TheraSkin graft and wound vac change out;  Surgeon: Newt Minion, MD;  Location: [place];  Service: Orthopedics;  Laterality: Right;  . Amputation Right 09/18/2014    transmetatarsal         Dr Sharol Given  . Amputation Right 09/18/2014    Procedure: Right Midfoot Amputation;  Surgeon:  Newt Minion, MD;  Location: [place];  Service: Orthopedics;  Laterality: Right;  . Amputation Right 09/19/2014    Procedure: AMPUTATION BELOW KNEE RIGHT;  Surgeon: Newt Minion, MD;  Location: [place];  Service: Orthopedics;  Laterality: Right;  . Stump revision Right 11/01/2014    Procedure: Revision Below Knee Amputation;  Surgeon: Newt Minion, MD;  Location: [place];  Service: Orthopedics;  Laterality: Right;    There were no vitals filed for this visit.  Visit Diagnosis:  Abnormality of gait  Balance problems  Impaired functional mobility and activity tolerance  Status post below knee amputation of right lower extremity      Subjective Assessment - 03/27/15 1134    Subjective Pt continuing to work on fitness program at [employer]. Reports adding a sock due to discomfort in socket.    Currently in Pain? No/denies     Pt able to mount and dismount bicycle outside and turn safely in both directions. PT performed demonstration and patient able to return demo, then ride 1/2 way around outside of building back to vehicle and put bike away in bed of truck independently.  Indoor level surface running with visual markers 3.5' apart cues for stride length. 30' x10.  Treadmill for 5 minutes at 2.69mh and 4.0 mph for walk/jog intervals. Running intervals lasted 15 seconds for 4 bouts.  Pivot turn on one foot in both directions, while carrying 15# box.  PT Education - 03/27/15 1106    Education provided Yes   Education Details Mounting and dismounting bicycle, proper footplacement and pedaling technique, fitness program at [employer] and use of machines. Running on treadmill and indoor level surfaces.    Person(s) Educated Patient   Methods Explanation;Demonstration;Verbal cues   Comprehension Verbalized understanding;Returned demonstration;Verbal cues required          PT Short Term Goals - 02/12/15 1241    PT SHORT TERM GOAL #1   Title  Patient tolerates >8hours /day wear without skin issues or limb pain. (Target Date: 02/14/15)   Status Achieved   PT SHORT TERM GOAL #2   Title Patient verbalizes proper prosthetic care except cues to adjust ply socks. (Target Date: 02/14/15)   Status Achieved   PT SHORT TERM GOAL #3   Title Patient ambulates 400' with LRAD / prosthesis with cues only. (Target Date: 02/14/15)   Status Achieved   PT SHORT TERM GOAL #4   Title Patient negotiates ramp & curb with LRAD /prosthesis with cues only. (Target Date: 02/14/15)   Status Achieved   PT SHORT TERM GOAL #5   Title Merrilee Jansky Balance >36/56 (Target Date: 02/14/15)   Baseline 02/11/15: scored 53/54   Status Achieved           PT Long Term Goals - 03/18/15 7628    PT LONG TERM GOAL #1   Title Patient is independent with prosthetic care. (Target Date: 03/14/15)   Baseline MET 03/10/15   Time 60   Period Days   Status Achieved   PT LONG TERM GOAL #2   Title Patient tolerates wear >90% of awake hours without skin issues or limb pain. (Target Date: 03/14/15)   Baseline MET 03/10/15   Time 60   Period Days   Status Achieved   PT LONG TERM GOAL #3   Title patient reports no back pain with activities. (Target Date: 03/14/15)   Baseline MET 03/10/15   Time 60   Period Days   Status Achieved   PT LONG TERM GOAL #4   Title Patient ambulates >1000' including grass, ramps, curbs, stairs with prosthesis only independently. (Target Date: 03/14/15)   Baseline MET 03/10/15   Time 60   Period Days   Status Achieved   PT LONG TERM GOAL #5   Title Patient lifts & carries 30#, climbs ladders, push / pull carts with prosthesis safely. (NEW Target Date: 04/11/15)   Time 4   Period Weeks   Status On-going   Additional Long Term Goals   Additional Long Term Goals Yes   PT LONG TERM GOAL #6   Title demonstrates riding bicycle including mounting / dismounting, start / stop and turning safely. (Target Date: 04/11/2015)   Time 4   Period Weeks   Status New   PT  LONG TERM GOAL #7   Title jogs 300' independently. (Target Date: 04/11/2015)   Time 4   Period Weeks   Status New   PT LONG TERM GOAL #8   Title ambulates >2000' on various surfaces with back pain increasing <2 increments. (Target Date: 04/11/2015)   Time 4   Period Weeks   Status New               Plan - 03/27/15 1106    Clinical Impression Statement Gait pattern for jogging is improving, able to ride bicycle and turn both directions safely.    Pt will benefit from skilled therapeutic intervention in order to improve on the  following deficits Decreased activity tolerance;Decreased balance;Decreased endurance;Decreased knowledge of use of DME;Decreased mobility;Decreased strength;Pain;Abnormal gait  prosthetic dependency   Rehab Potential Good   PT Frequency 2x / week   PT Duration Other (comment)  9 weeks    PT Treatment/Interventions ADLs/Self Care Home Management;DME Instruction;Gait training;Stair training;Functional mobility training;Therapeutic activities;Therapeutic exercise;Balance training;Neuromuscular re-education;Patient/family education;Other (comment)  prsothetic training   PT Next Visit Plan Continue to work on high level balance and strengthening.    Consulted and Agree with Plan of Care Patient        Problem List Patient Active Problem List   Diagnosis Date Noted  . Leg wound, right 10/23/2014  . Gas gangrene of foot 09/18/2014  . Compartment syndrome of right foot 09/04/2014  . Fracture of fifth toe, right, closed 09/04/2014  . Compartment syndrome of foot 09/04/2014  . Diabetes type 2, uncontrolled 08/24/2013  . HTN (hypertension) 08/24/2013  . Hyperglycemia 08/24/2013   Jamey Reas, PT, DPT PT Specializing in [place] 03/27/2015 5:06 PM Phone:  [phone]  Fax:  [phone] [place] [address] [place], [place] [address]  Cathy Crounse [place], [place] 03/27/2015, 11:50 AM  [place] [address] [place], [place], [address] Phone: [phone]   Fax:  [phone]

## 2015-04-02 ENCOUNTER — Ambulatory Visit: Payer: Worker's Compensation | Admitting: Physical Therapy

## 2015-04-02 ENCOUNTER — Encounter: Payer: Self-pay | Admitting: Physical Therapy

## 2015-04-02 DIAGNOSIS — R269 Unspecified abnormalities of gait and mobility: Secondary | ICD-10-CM | POA: Diagnosis not present

## 2015-04-02 DIAGNOSIS — Z7409 Other reduced mobility: Secondary | ICD-10-CM

## 2015-04-02 DIAGNOSIS — R2689 Other abnormalities of gait and mobility: Secondary | ICD-10-CM

## 2015-04-02 NOTE — Therapy (Signed)
[facility] [address] [place], [place], [address] Phone: [phone]   Fax:  [phone]  Physical Therapy Treatment  Patient Details  Name: Marc Taylor MRN: [id number] Date of Birth: [birth year] Referring Provider:  Gaynelle Arabian, MD  Encounter Date: 04/02/2015      PT End of Session - 04/02/15 1449    Visit Number 22   Number of Visits 25   Date for PT Re-Evaluation 04/11/15   PT Start Time 1448   PT Stop Time 1530   PT Time Calculation (min) 42 min   Equipment Utilized During Treatment Gait belt   Activity Tolerance Patient tolerated treatment well   Behavior During Therapy [name] for tasks assessed/performed      Past Medical History  Diagnosis Date  . Diabetes mellitus without complication   . Hypertension   . Type II diabetes mellitus     "just dx'd" (08/24/2013)  . History of blood transfusion 1963    "one; I had yellow jaundice" (08/24/2013)  . Compartment syndrome of right foot 09/04/2014  . Fracture of fifth toe, right, closed 09/04/2014  . Constipation     Past Surgical History  Procedure Laterality Date  . Fasciotomy Right 09/04/2014    Procedure: Right Foot FASCIOTOMY and wound vac placement;  Surgeon: Johnny Bridge, MD;  Location: [place];  Service: Orthopedics;  Laterality: Right;  . Open reduction internal fixation (orif) foot lisfranc fracture Right 09/04/2014    Procedure: OPEN REDUCTION INTERNAL FIXATION (ORIF) FOOT LISFRANC FRACTURE;  Surgeon: Johnny Bridge, MD;  Location: [place];  Service: Orthopedics;  Laterality: Right;  . I&d extremity Right 09/06/2014    Procedure: Irrigation and Debridement Right Foot, TheraSkin graft and wound vac change out;  Surgeon: Newt Minion, MD;  Location: [place];  Service: Orthopedics;  Laterality: Right;  . Amputation Right 09/18/2014    transmetatarsal         Dr Sharol Given  . Amputation Right 09/18/2014    Procedure: Right Midfoot Amputation;  Surgeon:  Newt Minion, MD;  Location: [place];  Service: Orthopedics;  Laterality: Right;  . Amputation Right 09/19/2014    Procedure: AMPUTATION BELOW KNEE RIGHT;  Surgeon: Newt Minion, MD;  Location: [place];  Service: Orthopedics;  Laterality: Right;  . Stump revision Right 11/01/2014    Procedure: Revision Below Knee Amputation;  Surgeon: Newt Minion, MD;  Location: [place];  Service: Orthopedics;  Laterality: Right;    There were no vitals filed for this visit.  Visit Diagnosis:  Abnormality of gait  Balance problems  Impaired functional mobility and activity tolerance      Subjective Assessment - 04/02/15 1448    Subjective No pain or falls to report. Has been riding bike with no issues. Still going to [facility] with no issues.   Currently in Pain? No/denies   Pain Score 0-No pain                         [facility] Adult PT Treatment/Exercise - 04/02/15 1451    Ambulation/Gait   Ambulation/Gait Yes   Ambulation Distance (Feet) 1000 Feet   Assistive device Prosthesis   Gait Pattern Step-through pattern;Decreased stance time - right;Decreased step length - left;Antalgic   Ambulation Surface Level;Unlevel;Indoor;Outdoor;Paved   Knee/Hip Exercises: Aerobic   Elliptical level 1.0 x 1 minute each forward/backward with bil UE support   Prosthetics   Current prosthetic wear tolerance (days/week)  7 days/wk  Current prosthetic wear tolerance (#hours/day)  all awake hours   Residual limb condition  intact   Donning Prosthesis Modified independent (device/increased time)   Doffing Prosthesis Modified independent (device/increased time)   Knee/Hip Exercises: Machines for Strengthening   Cybex Leg Press bil legs 150# 5 sec hold x 10 reps;prosthesis only 80# 5 sec hold x 10 reps                         Running drills indoors with visual markers spaced 3.5 feet apart across a 40 foot pathway x 6-7 laps with cues on foot placement and push off for power/stride.  Dribbling ball: with  turns each way, fwd, bwd, quick direction changes x 3-4 minutes with emphasis on technique with pivot turns.        PT Short Term Goals - 02/12/15 1241    PT SHORT TERM GOAL #1   Title Patient tolerates >8hours /day wear without skin issues or limb pain. (Target Date: 02/14/15)   Status Achieved   PT SHORT TERM GOAL #2   Title Patient verbalizes proper prosthetic care except cues to adjust ply socks. (Target Date: 02/14/15)   Status Achieved   PT SHORT TERM GOAL #3   Title Patient ambulates 400' with LRAD / prosthesis with cues only. (Target Date: 02/14/15)   Status Achieved   PT SHORT TERM GOAL #4   Title Patient negotiates ramp & curb with LRAD /prosthesis with cues only. (Target Date: 02/14/15)   Status Achieved   PT SHORT TERM GOAL #5   Title Merrilee Jansky Balance >36/56 (Target Date: 02/14/15)   Baseline 02/11/15: scored 53/54   Status Achieved           PT Long Term Goals - 03/18/15 2951    PT LONG TERM GOAL #1   Title Patient is independent with prosthetic care. (Target Date: 03/14/15)   Baseline MET 03/10/15   Time 60   Period Days   Status Achieved   PT LONG TERM GOAL #2   Title Patient tolerates wear >90% of awake hours without skin issues or limb pain. (Target Date: 03/14/15)   Baseline MET 03/10/15   Time 60   Period Days   Status Achieved   PT LONG TERM GOAL #3   Title patient reports no back pain with activities. (Target Date: 03/14/15)   Baseline MET 03/10/15   Time 60   Period Days   Status Achieved   PT LONG TERM GOAL #4   Title Patient ambulates >1000' including grass, ramps, curbs, stairs with prosthesis only independently. (Target Date: 03/14/15)   Baseline MET 03/10/15   Time 60   Period Days   Status Achieved   PT LONG TERM GOAL #5   Title Patient lifts & carries 30#, climbs ladders, push / pull carts with prosthesis safely. (NEW Target Date: 04/11/15)   Time 4   Period Weeks   Status On-going   Additional Long Term Goals   Additional Long Term Goals Yes   PT  LONG TERM GOAL #6   Title demonstrates riding bicycle including mounting / dismounting, start / stop and turning safely. (Target Date: 04/11/2015)   Time 4   Period Weeks   Status New   PT LONG TERM GOAL #7   Title jogs 300' independently. (Target Date: 04/11/2015)   Time 4   Period Weeks   Status New   PT LONG TERM GOAL #8   Title ambulates >2000' on various surfaces with  back pain increasing <2 increments. (Target Date: 04/11/2015)   Time 4   Period Weeks   Status New               Plan - 04/02/15 1449    Clinical Impression Statement Continued to work on advanced balance and strengthening activities today without any issues reported. Pt making steady progress toward goals.   Pt will benefit from skilled therapeutic intervention in order to improve on the following deficits Decreased activity tolerance;Decreased balance;Decreased endurance;Decreased knowledge of use of DME;Decreased mobility;Decreased strength;Pain;Abnormal gait  prosthetic dependency   Rehab Potential Good   PT Frequency 2x / week   PT Duration Other (comment)  9 weeks    PT Treatment/Interventions ADLs/Self Care Home Management;DME Instruction;Gait training;Stair training;Functional mobility training;Therapeutic activities;Therapeutic exercise;Balance training;Neuromuscular re-education;Patient/family education;Other (comment)  prsothetic training   PT Next Visit Plan Continue to work on high level balance and strengthening.    Consulted and Agree with Plan of Care Patient        Problem List Patient Active Problem List   Diagnosis Date Noted  . Leg wound, right 10/23/2014  . Gas gangrene of foot 09/18/2014  . Compartment syndrome of right foot 09/04/2014  . Fracture of fifth toe, right, closed 09/04/2014  . Compartment syndrome of foot 09/04/2014  . Diabetes type 2, uncontrolled 08/24/2013  . HTN (hypertension) 08/24/2013  . Hyperglycemia 08/24/2013    Willow Ora 04/03/2015, 6:15 PM  Willow Ora, PTA, [place] [address], [place], [place] [address] [phone] 04/03/2015, 6:15 PM

## 2015-04-04 ENCOUNTER — Ambulatory Visit: Payer: Worker's Compensation | Admitting: Physical Therapy

## 2015-04-04 ENCOUNTER — Encounter: Payer: Self-pay | Admitting: Physical Therapy

## 2015-04-04 DIAGNOSIS — R269 Unspecified abnormalities of gait and mobility: Secondary | ICD-10-CM | POA: Diagnosis not present

## 2015-04-04 DIAGNOSIS — R2689 Other abnormalities of gait and mobility: Secondary | ICD-10-CM

## 2015-04-04 DIAGNOSIS — Z7409 Other reduced mobility: Secondary | ICD-10-CM

## 2015-04-04 NOTE — Therapy (Signed)
[facility] [address] [place], [place], [address] Phone: [phone]   Fax:  [phone]  Physical Therapy Treatment  Patient Details  Name: Marc Taylor MRN: [id number] Date of Birth: 1961-03-24 Referring Provider:  Gaynelle Arabian, MD  Encounter Date: 04/04/2015      PT End of Session - 04/04/15 1148    Visit Number 23   Number of Visits 25   Date for PT Re-Evaluation 04/11/15   PT Start Time 2025   PT end time 12:30   PT total treatment time 45 minutes   Equipment Utilized During Treatment Gait belt   Activity Tolerance Patient tolerated treatment well   Behavior During Therapy [facility] for tasks assessed/performed      Past Medical History  Diagnosis Date  . Diabetes mellitus without complication   . Hypertension   . Type II diabetes mellitus     "just dx'd" (08/24/2013)  . History of blood transfusion 1963    "one; I had yellow jaundice" (08/24/2013)  . Compartment syndrome of right foot 09/04/2014  . Fracture of fifth toe, right, closed 09/04/2014  . Constipation     Past Surgical History  Procedure Laterality Date  . Fasciotomy Right 09/04/2014    Procedure: Right Foot FASCIOTOMY and wound vac placement;  Surgeon: Johnny Bridge, MD;  Location: [place];  Service: Orthopedics;  Laterality: Right;  . Open reduction internal fixation (orif) foot lisfranc fracture Right 09/04/2014    Procedure: OPEN REDUCTION INTERNAL FIXATION (ORIF) FOOT LISFRANC FRACTURE;  Surgeon: Johnny Bridge, MD;  Location: [place];  Service: Orthopedics;  Laterality: Right;  . I&d extremity Right 09/06/2014    Procedure: Irrigation and Debridement Right Foot, TheraSkin graft and wound vac change out;  Surgeon: Newt Minion, MD;  Location: [facility];  Service: Orthopedics;  Laterality: Right;  . Amputation Right 09/18/2014    transmetatarsal         Dr Sharol Given  . Amputation Right 09/18/2014    Procedure: Right Midfoot Amputation;   Surgeon: Newt Minion, MD;  Location: [place];  Service: Orthopedics;  Laterality: Right;  . Amputation Right 09/19/2014    Procedure: AMPUTATION BELOW KNEE RIGHT;  Surgeon: Newt Minion, MD;  Location: [facility];  Service: Orthopedics;  Laterality: Right;  . Stump revision Right 11/01/2014    Procedure: Revision Below Knee Amputation;  Surgeon: Newt Minion, MD;  Location: [facility];  Service: Orthopedics;  Laterality: Right;    There were no vitals filed for this visit.  Visit Diagnosis:  Abnormality of gait  Balance problems  Impaired functional mobility and activity tolerance      Subjective Assessment - 04/04/15 1148    Subjective No new complaints. No falls or pain to report.    Pain Score 0-No pain     Treatment: Elliptical level 1.0 x 1 minute each forward and backwards with UE support.  Treadmill level 0.8 mph, x 5 minutes in reverse facing backwards working on walking down hills with bil UE supports.   Forward walking lunges along 40 foot pathway x 4 laps with min guard assist  Side stepping in squat position performed after attempting side shuffling with poor balance/ex technique, along 40 foot pathway, x 4 laps  Running drills indoors with visual markers spaced 3.5 feet apart across a 40 foot pathway x 6-7 laps with cues on foot placement and push off for power/stride.  Dribbling ball: with turns each way, fwd, bwd, quick direction changes x  3-4 minutes with emphasis on technique with pivot turns.  Lifting and carrying 30# crate 115 feet x 2 laps with cues on posture and lifting technique.  Pushing/pulling fully loaded weight cart 12 feet x 2 laps each way with cues on technique and posture/body mechanics.           PT Short Term Goals - 02/12/15 1241    PT SHORT TERM GOAL #1   Title Patient tolerates >8hours /day wear without skin issues or limb pain. (Target Date: 02/14/15)   Status Achieved   PT SHORT TERM GOAL #2   Title Patient verbalizes proper prosthetic  care except cues to adjust ply socks. (Target Date: 02/14/15)   Status Achieved   PT SHORT TERM GOAL #3   Title Patient ambulates 400' with LRAD / prosthesis with cues only. (Target Date: 02/14/15)   Status Achieved   PT SHORT TERM GOAL #4   Title Patient negotiates ramp & curb with LRAD /prosthesis with cues only. (Target Date: 02/14/15)   Status Achieved   PT SHORT TERM GOAL #5   Title Merrilee Jansky Balance >36/56 (Target Date: 02/14/15)   Baseline 02/11/15: scored 53/54   Status Achieved           PT Long Term Goals - 03/18/15 8786    PT LONG TERM GOAL #1   Title Patient is independent with prosthetic care. (Target Date: 03/14/15)   Baseline MET 03/10/15   Time 60   Period Days   Status Achieved   PT LONG TERM GOAL #2   Title Patient tolerates wear >90% of awake hours without skin issues or limb pain. (Target Date: 03/14/15)   Baseline MET 03/10/15   Time 60   Period Days   Status Achieved   PT LONG TERM GOAL #3   Title patient reports no back pain with activities. (Target Date: 03/14/15)   Baseline MET 03/10/15   Time 60   Period Days   Status Achieved   PT LONG TERM GOAL #4   Title Patient ambulates >1000' including grass, ramps, curbs, stairs with prosthesis only independently. (Target Date: 03/14/15)   Baseline MET 03/10/15   Time 60   Period Days   Status Achieved   PT LONG TERM GOAL #5   Title Patient lifts & carries 30#, climbs ladders, push / pull carts with prosthesis safely. (NEW Target Date: 04/11/15)   Time 4   Period Weeks   Status On-going   Additional Long Term Goals   Additional Long Term Goals Yes   PT LONG TERM GOAL #6   Title demonstrates riding bicycle including mounting / dismounting, start / stop and turning safely. (Target Date: 04/11/2015)   Time 4   Period Weeks   Status New   PT LONG TERM GOAL #7   Title jogs 300' independently. (Target Date: 04/11/2015)   Time 4   Period Weeks   Status New   PT LONG TERM GOAL #8   Title ambulates >2000' on various  surfaces with back pain increasing <2 increments. (Target Date: 04/11/2015)   Time 4   Period Weeks   Status New               Plan - 04/04/15 1149    Clinical impression Pt making steady progress toward goals.   Pt will benefit from skilled therapeutic intervention in order to improve on the following deficits Decreased activity tolerance;Decreased balance;Decreased endurance;Decreased knowledge of use of DME;Decreased mobility;Decreased strength;Pain;Abnormal gait  prosthetic dependency   Rehab  Potential Good   PT Frequency 2x / week   PT Duration Other (comment)  9 weeks    PT Treatment/Interventions ADLs/Self Care Home Management;DME Instruction;Gait training;Stair training;Functional mobility training;Therapeutic activities;Therapeutic exercise;Balance training;Neuromuscular re-education;Patient/family education;Other (comment)  prsothetic training   PT Next Visit Plan Continue to work on high level balance and strengthening.    Consulted and Agree with Plan of Care Patient        Problem List Patient Active Problem List   Diagnosis Date Noted  . Leg wound, right 10/23/2014  . Gas gangrene of foot 09/18/2014  . Compartment syndrome of right foot 09/04/2014  . Fracture of fifth toe, right, closed 09/04/2014  . Compartment syndrome of foot 09/04/2014  . Diabetes type 2, uncontrolled 08/24/2013  . HTN (hypertension) 08/24/2013  . Hyperglycemia 08/24/2013    Willow Ora 04/04/2015, 12:01 PM  Willow Ora, PTA, [facility] [address] [place], [place] [address] [phone] 04/04/2015, 12:02 PM

## 2015-04-07 ENCOUNTER — Ambulatory Visit: Payer: Worker's Compensation | Admitting: Physical Therapy

## 2015-04-07 ENCOUNTER — Encounter: Payer: Self-pay | Admitting: Physical Therapy

## 2015-04-07 DIAGNOSIS — Z89511 Acquired absence of right leg below knee: Secondary | ICD-10-CM

## 2015-04-07 DIAGNOSIS — Z7409 Other reduced mobility: Secondary | ICD-10-CM

## 2015-04-07 DIAGNOSIS — R269 Unspecified abnormalities of gait and mobility: Secondary | ICD-10-CM | POA: Diagnosis not present

## 2015-04-07 DIAGNOSIS — R2689 Other abnormalities of gait and mobility: Secondary | ICD-10-CM

## 2015-04-07 NOTE — Therapy (Signed)
[facility] [address] [place], [place], [address] Phone: [phone]   Fax:  [phone]  Physical Therapy Treatment  Patient Details  Name: Marc Taylor MRN: [id number] Date of Birth: [birth year] Referring Provider:  Gaynelle Arabian, MD  Encounter Date: 04/07/2015      PT End of Session - 04/07/15 1402    Visit Number 24   Number of Visits 25   Date for PT Re-Evaluation 04/11/15   PT Start Time 1402   PT Stop Time 1443   PT Time Calculation (min) 41 min   Equipment Utilized During Treatment Gait belt   Activity Tolerance Patient tolerated treatment well   Behavior During Therapy [facility] for tasks assessed/performed      Past Medical History  Diagnosis Date  . Diabetes mellitus without complication   . Hypertension   . Type II diabetes mellitus     "just dx'd" (08/24/2013)  . History of blood transfusion 1963    "one; I had yellow jaundice" (08/24/2013)  . Compartment syndrome of right foot 09/04/2014  . Fracture of fifth toe, right, closed 09/04/2014  . Constipation     Past Surgical History  Procedure Laterality Date  . Fasciotomy Right 09/04/2014    Procedure: Right Foot FASCIOTOMY and wound vac placement;  Surgeon: Johnny Bridge, MD;  Location: [facility];  Service: Orthopedics;  Laterality: Right;  . Open reduction internal fixation (orif) foot lisfranc fracture Right 09/04/2014    Procedure: OPEN REDUCTION INTERNAL FIXATION (ORIF) FOOT LISFRANC FRACTURE;  Surgeon: Johnny Bridge, MD;  Location: [facility];  Service: Orthopedics;  Laterality: Right;  . I&d extremity Right 09/06/2014    Procedure: Irrigation and Debridement Right Foot, TheraSkin graft and wound vac change out;  Surgeon: Newt Minion, MD;  Location: [facility];  Service: Orthopedics;  Laterality: Right;  . Amputation Right 09/18/2014    transmetatarsal         Dr Sharol Given  . Amputation Right 09/18/2014    Procedure: Right Midfoot Amputation;  Surgeon:  Newt Minion, MD;  Location: [facility];  Service: Orthopedics;  Laterality: Right;  . Amputation Right 09/19/2014    Procedure: AMPUTATION BELOW KNEE RIGHT;  Surgeon: Newt Minion, MD;  Location: [facility];  Service: Orthopedics;  Laterality: Right;  . Stump revision Right 11/01/2014    Procedure: Revision Below Knee Amputation;  Surgeon: Newt Minion, MD;  Location: [facility];  Service: Orthopedics;  Laterality: Right;    There were no vitals filed for this visit.  Visit Diagnosis:  Abnormality of gait  Balance problems  Impaired functional mobility and activity tolerance  Status post below knee amputation of right lower extremity      Subjective Assessment - 04/07/15 1603    Subjective Pt is still going to [facility] using the recumbent stepper and leg press machines.    Currently in Pain? No/denies     AMBULATION Pt ambulated 200' without assistive device. Required verbal and visual cues for maintaining step width equal distance.  Side stepping 20' x2 in both directions with verbal cues for weight shift onto prosthesis and proper ABD/ADD step pattern.  Braiding step pattern 20'x2 in both directions, HHA for first attempt, mod independent on second attempt. Pt able to maintain balance and good posture throughout exercise.  Tandem gait 20' x 5 with SBA /cues Treadmill ambulation for 10 minutes. 30 sec jogging bout at 4.5 mph x2, all other time was 2.66mh. Cues for HHA, alternating hand rail every  minute to decrease lateral trunk lean to L and ABD gait on the right. Pt able to correct posture and step width with verbal cues.  Instructed pt to use treadmill at [facility] and add this to his HEP, no running, only walking at this time. Demonstrated how to take a pulse and provided a safe exercise HR range for the pt to follow.                              PT Education - 04/07/15 1604    Education provided Yes   Education Details Verbalized proper step pattern for treadmill walking,  independent use of treadmill at [facility]) Educated Patient   Methods Explanation   Comprehension Verbalized understanding          PT Short Term Goals - 02/12/15 1241    PT SHORT TERM GOAL #1   Title Patient tolerates >8hours /day wear without skin issues or limb pain. (Target Date: 02/14/15)   Status Achieved   PT SHORT TERM GOAL #2   Title Patient verbalizes proper prosthetic care except cues to adjust ply socks. (Target Date: 02/14/15)   Status Achieved   PT SHORT TERM GOAL #3   Title Patient ambulates 400' with LRAD / prosthesis with cues only. (Target Date: 02/14/15)   Status Achieved   PT SHORT TERM GOAL #4   Title Patient negotiates ramp & curb with LRAD /prosthesis with cues only. (Target Date: 02/14/15)   Status Achieved   PT SHORT TERM GOAL #5   Title Merrilee Jansky Balance >36/56 (Target Date: 02/14/15)   Baseline 02/11/15: scored 53/54   Status Achieved           PT Long Term Goals - 03/18/15 1856    PT LONG TERM GOAL #1   Title Patient is independent with prosthetic care. (Target Date: 03/14/15)   Baseline MET 03/10/15   Time 60   Period Days   Status Achieved   PT LONG TERM GOAL #2   Title Patient tolerates wear >90% of awake hours without skin issues or limb pain. (Target Date: 03/14/15)   Baseline MET 03/10/15   Time 60   Period Days   Status Achieved   PT LONG TERM GOAL #3   Title patient reports no back pain with activities. (Target Date: 03/14/15)   Baseline MET 03/10/15   Time 60   Period Days   Status Achieved   PT LONG TERM GOAL #4   Title Patient ambulates >1000' including grass, ramps, curbs, stairs with prosthesis only independently. (Target Date: 03/14/15)   Baseline MET 03/10/15   Time 60   Period Days   Status Achieved   PT LONG TERM GOAL #5   Title Patient lifts & carries 30#, climbs ladders, push / pull carts with prosthesis safely. (NEW Target Date: 04/11/15)   Time 4   Period Weeks   Status On-going   Additional Long Term Goals   Additional Long  Term Goals Yes   PT LONG TERM GOAL #6   Title demonstrates riding bicycle including mounting / dismounting, start / stop and turning safely. (Target Date: 04/11/2015)   Time 4   Period Weeks   Status New   PT LONG TERM GOAL #7   Title jogs 300' independently. (Target Date: 04/11/2015)   Time 4   Period Weeks   Status New   PT LONG TERM GOAL #8   Title ambulates >2000' on various  surfaces with back pain increasing <2 increments. (Target Date: 04/11/2015)   Time 4   Period Weeks   Status New               Plan - 04/07/15 1402    Clinical Impression Statement Pt presented with ABD gait and medial whip of the prosthesis. Has an appt with prosthetist on Wednesday to add padding into socket. Continues to progress towards goals and increase endurance for greater activity tolerance.    Pt will benefit from skilled therapeutic intervention in order to improve on the following deficits Decreased activity tolerance;Decreased balance;Decreased endurance;Decreased knowledge of use of DME;Decreased mobility;Decreased strength;Pain;Abnormal gait  prosthetic dependency   Rehab Potential Good   PT Frequency 2x / week   PT Duration Other (comment)  9 weeks    PT Treatment/Interventions ADLs/Self Care Home Management;DME Instruction;Gait training;Stair training;Functional mobility training;Therapeutic activities;Therapeutic exercise;Balance training;Neuromuscular re-education;Patient/family education;Other (comment)  prsothetic training   PT Next Visit Plan Continue to work on high level balance and strengthening. Reassess LTGs   Consulted and Agree with Plan of Care Patient        Problem List Patient Active Problem List   Diagnosis Date Noted  . Leg wound, right 10/23/2014  . Gas gangrene of foot 09/18/2014  . Compartment syndrome of right foot 09/04/2014  . Fracture of fifth toe, right, closed 09/04/2014  . Compartment syndrome of foot 09/04/2014  . Diabetes type 2, uncontrolled  08/24/2013  . HTN (hypertension) 08/24/2013  . Hyperglycemia 08/24/2013   Jamey Reas, PT, DPT PT Specializing in [place] 04/07/2015 4:47 PM Phone:  [phone]  Fax:  [phone] [facility] [address] [place], [place] [address]  [place] Elizette Shek, [place] 04/07/2015, 4:47 PM  [facility] [address] [place], [place], [address] Phone: [phone]   Fax:  [phone]

## 2015-04-09 ENCOUNTER — Encounter: Payer: Self-pay | Admitting: Physical Therapy

## 2015-04-09 ENCOUNTER — Ambulatory Visit: Payer: 59 | Attending: Family Medicine | Admitting: Physical Therapy

## 2015-04-09 DIAGNOSIS — Z89511 Acquired absence of right leg below knee: Secondary | ICD-10-CM | POA: Insufficient documentation

## 2015-04-09 DIAGNOSIS — R29818 Other symptoms and signs involving the nervous system: Secondary | ICD-10-CM | POA: Diagnosis not present

## 2015-04-09 DIAGNOSIS — R2689 Other abnormalities of gait and mobility: Secondary | ICD-10-CM

## 2015-04-09 DIAGNOSIS — Z7409 Other reduced mobility: Secondary | ICD-10-CM | POA: Insufficient documentation

## 2015-04-09 DIAGNOSIS — R269 Unspecified abnormalities of gait and mobility: Secondary | ICD-10-CM | POA: Diagnosis not present

## 2015-04-09 NOTE — Therapy (Signed)
[facility] [address] [place], [place], [address] Phone: [phone]   Fax:  [phone]  Physical Therapy Treatment  Patient Details  Name: Marc Taylor MRN: [id number] Date of Birth: 1961-09-09 Referring Provider:  Gaynelle Arabian, MD  Encounter Date: 04/09/2015      PT End of Session - 04/09/15 1445    Visit Number 25   Number of Visits 25   Date for PT Re-Evaluation 04/11/15   PT Start Time 2902   PT Stop Time 1525   PT Time Calculation (min) 40 min   Equipment Utilized During Treatment Gait belt   Activity Tolerance Patient tolerated treatment well   Behavior During Therapy [facility] for tasks assessed/performed      Past Medical History  Diagnosis Date  . Diabetes mellitus without complication   . Hypertension   . Type II diabetes mellitus     "just dx'd" (08/24/2013)  . History of blood transfusion 1963    "one; I had yellow jaundice" (08/24/2013)  . Compartment syndrome of right foot 09/04/2014  . Fracture of fifth toe, right, closed 09/04/2014  . Constipation     Past Surgical History  Procedure Laterality Date  . Fasciotomy Right 09/04/2014    Procedure: Right Foot FASCIOTOMY and wound vac placement;  Surgeon: Johnny Bridge, MD;  Location: [facility];  Service: Orthopedics;  Laterality: Right;  . Open reduction internal fixation (orif) foot lisfranc fracture Right 09/04/2014    Procedure: OPEN REDUCTION INTERNAL FIXATION (ORIF) FOOT LISFRANC FRACTURE;  Surgeon: Johnny Bridge, MD;  Location: [facility];  Service: Orthopedics;  Laterality: Right;  . I&d extremity Right 09/06/2014    Procedure: Irrigation and Debridement Right Foot, TheraSkin graft and wound vac change out;  Surgeon: Newt Minion, MD;  Location: [facility];  Service: Orthopedics;  Laterality: Right;  . Amputation Right 09/18/2014    transmetatarsal         Dr Sharol Given  . Amputation Right 09/18/2014    Procedure: Right Midfoot Amputation;  Surgeon:  Newt Minion, MD;  Location: [place];  Service: Orthopedics;  Laterality: Right;  . Amputation Right 09/19/2014    Procedure: AMPUTATION BELOW KNEE RIGHT;  Surgeon: Newt Minion, MD;  Location: [facility];  Service: Orthopedics;  Laterality: Right;  . Stump revision Right 11/01/2014    Procedure: Revision Below Knee Amputation;  Surgeon: Newt Minion, MD;  Location: [facility];  Service: Orthopedics;  Laterality: Right;    There were no vitals filed for this visit.  Visit Diagnosis:  Abnormality of gait  Balance problems  Impaired functional mobility and activity tolerance  Status post below knee amputation of right lower extremity      Subjective Assessment - 04/09/15 1445    Subjective Patient reports riding bicycle 2 miles without issues and prosthetic foot is staying on pedal better.   Currently in Pain? No/denies     Prosthetic Training: Patient arrived for PT session >45 min early and fell asleep in lobby chair in awkward position. Back pain upon initial standing 7/10. After walking 100' to gym, back pain down to 2/10 and by end of session 0/10. Patient lifted 30# & carried box 30' X 2. Patient able to push & pull loaded wheeled cart 10' safely. Patient able to climb A-frame ladder correctly & simulate 2 handed task safely.  Patient able to jog at slow pace 300' independently. Patient ambulated 2200' with prosthesis only on grass, gravel, ramps, curbs independently.  PT Education - 04/09/15 1445    Education provided Yes   Education Details continuing with ongoing exercise program at [facility]) Educated Patient   Methods Explanation   Comprehension Verbalized understanding          PT Short Term Goals - 02/12/15 1241    PT SHORT TERM GOAL #1   Title Patient tolerates >8hours /day wear without skin issues or limb pain. (Target Date: 02/14/15)   Status Achieved   PT SHORT TERM GOAL #2   Title Patient verbalizes proper  prosthetic care except cues to adjust ply socks. (Target Date: 02/14/15)   Status Achieved   PT SHORT TERM GOAL #3   Title Patient ambulates 400' with LRAD / prosthesis with cues only. (Target Date: 02/14/15)   Status Achieved   PT SHORT TERM GOAL #4   Title Patient negotiates ramp & curb with LRAD /prosthesis with cues only. (Target Date: 02/14/15)   Status Achieved   PT SHORT TERM GOAL #5   Title Merrilee Jansky Balance >36/56 (Target Date: 02/14/15)   Baseline 02/11/15: scored 53/54   Status Achieved           PT Long Term Goals - 04/09/15 1445    PT LONG TERM GOAL #1   Title Patient is independent with prosthetic care. (Target Date: 03/14/15)   Baseline MET 03/10/15   Time 60   Period Days   Status Achieved   PT LONG TERM GOAL #2   Title Patient tolerates wear >90% of awake hours without skin issues or limb pain. (Target Date: 03/14/15)   Baseline MET 03/10/15   Time 60   Period Days   Status Achieved   PT LONG TERM GOAL #3   Title patient reports no back pain with activities. (Target Date: 03/14/15)   Baseline MET 03/10/15   Time 60   Period Days   Status Achieved   PT LONG TERM GOAL #4   Title Patient ambulates >1000' including grass, ramps, curbs, stairs with prosthesis only independently. (Target Date: 03/14/15)   Baseline MET 03/10/15   Time 60   Period Days   Status Achieved   PT LONG TERM GOAL #5   Title Patient lifts & carries 30#, climbs ladders, push / pull carts with prosthesis safely. (NEW Target Date: 04/11/15)   Baseline MET  03/17/2015   Time 4   Period Weeks   Status Achieved   PT LONG TERM GOAL #6   Title demonstrates riding bicycle including mounting / dismounting, start / stop and turning safely. (Target Date: 04/11/2015)   Baseline MET 04/09/2015 Patient reports riding 2 miles with increased coordination.    Time 4   Period Weeks   Status Achieved   PT LONG TERM GOAL #7   Title jogs 300' independently. (Target Date: 04/11/2015)   Baseline MET 04/09/2015   Time 4    Period Weeks   Status Achieved   PT LONG TERM GOAL #8   Title ambulates >2000' on various surfaces with back pain increasing <2 increments. (Target Date: 04/11/2015)   Baseline MET 04/09/2015    Time 4   Period Weeks   Status Achieved               Plan - 04/09/15 1445    Clinical Impression Statement Patient met all LTGs. 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 Patient needs a Functional capacity evaluation to determine limits for work acitivities.  Pt will benefit from skilled therapeutic intervention in order to improve on the following deficits Decreased activity tolerance;Decreased balance;Decreased endurance;Decreased knowledge of use of DME;Decreased mobility;Decreased strength;Pain;Abnormal gait  prosthetic dependency   Rehab Potential Good   PT Frequency 2x / week   PT Duration Other (comment)  9 weeks    PT Treatment/Interventions ADLs/Self Care Home Management;DME Instruction;Gait training;Stair training;Functional mobility training;Therapeutic activities;Therapeutic exercise;Balance training;Neuromuscular re-education;Patient/family education;Other (comment)  prsothetic training   PT Next Visit Plan discharge   Consulted and Agree with Plan of Care Patient        Problem List Patient Active Problem List   Diagnosis Date Noted  . Leg wound, right 10/23/2014  . Gas gangrene of foot 09/18/2014  . Compartment syndrome of right foot 09/04/2014  . Fracture of fifth toe, right, closed 09/04/2014  . Compartment syndrome of foot 09/04/2014  . Diabetes type 2, uncontrolled 08/24/2013  . HTN (hypertension) 08/24/2013  . Hyperglycemia 08/24/2013    Mozell Haber PT, DPT 04/09/2015, 10:23 PM  [facility] [address] [place], [place], [address] Phone: [phone]   Fax:  [phone]

## 2015-04-09 NOTE — Therapy (Signed)
[facility] [address] [place], [place], [address] Phone: [phone]   Fax:  [phone]  Patient Details  Name: Marc Taylor MRN: [id number] Date of Birth: [birth year] Referring Provider:  No ref. provider found  Encounter Date: 04/09/2015  PHYSICAL THERAPY DISCHARGE SUMMARY  Visits from Start of Care: 25  Current functional level related to goals / functional outcomes:     PT Long Term Goals - 04/09/15 1445    PT LONG TERM GOAL #1   Title Patient is independent with prosthetic care. (Target Date: 03/14/15)   Baseline MET 03/10/15   Time 60   Period Days   Status Achieved   PT LONG TERM GOAL #2   Title Patient tolerates wear >90% of awake hours without skin issues or limb pain. (Target Date: 03/14/15)   Baseline MET 03/10/15   Time 60   Period Days   Status Achieved   PT LONG TERM GOAL #3   Title patient reports no back pain with activities. (Target Date: 03/14/15)   Baseline MET 03/10/15   Time 60   Period Days   Status Achieved   PT LONG TERM GOAL #4   Title Patient ambulates >1000' including grass, ramps, curbs, stairs with prosthesis only independently. (Target Date: 03/14/15)   Baseline MET 03/10/15   Time 60   Period Days   Status Achieved   PT LONG TERM GOAL #5   Title Patient lifts & carries 30#, climbs ladders, push / pull carts with prosthesis safely. (NEW Target Date: 04/11/15)   Baseline MET  03/17/2015   Time 4   Period Weeks   Status Achieved   PT LONG TERM GOAL #6   Title demonstrates riding bicycle including mounting / dismounting, start / stop and turning safely. (Target Date: 04/11/2015)   Baseline MET 04/09/2015 Patient reports riding 2 miles with increased coordination.    Time 4   Period Weeks   Status Achieved   PT LONG TERM GOAL #7   Title jogs 300' independently. (Target Date: 04/11/2015)   Baseline MET 04/09/2015   Time 4   Period Weeks   Status Achieved   PT LONG TERM GOAL #8   Title ambulates >2000' on various surfaces with back pain increasing <2 increments. (Target Date: 04/11/2015)   Baseline MET 04/09/2015    Time 4   Period Weeks   Status Achieved       Remaining deficits: Limited lifting weights and repetition of lifting. Needs FCE to determine details of limitations.    Education / Equipment: Charity fundraiser. Plan: Patient agrees to discharge.  Patient goals were met. Patient is being discharged due to meeting the stated rehab goals.  ?????       Alizon Schmeling PT, DPT 04/09/2015, 10:32 PM  [facility] [address] [place], [place], [address] Phone: [phone]   Fax:  [phone]

## 2016-04-18 IMAGING — CR DG TOE 5TH 2+V*R*
1 series · 3 of 3 positions shown · non-contrast
Comparison: Same day.

CLINICAL DATA: Right fifth toe fracture status post reduction.

EXAM:
RIGHT FIFTH TOE

[Series 1: AP · right · 3 of 3 slices shown]
[im 1/3]
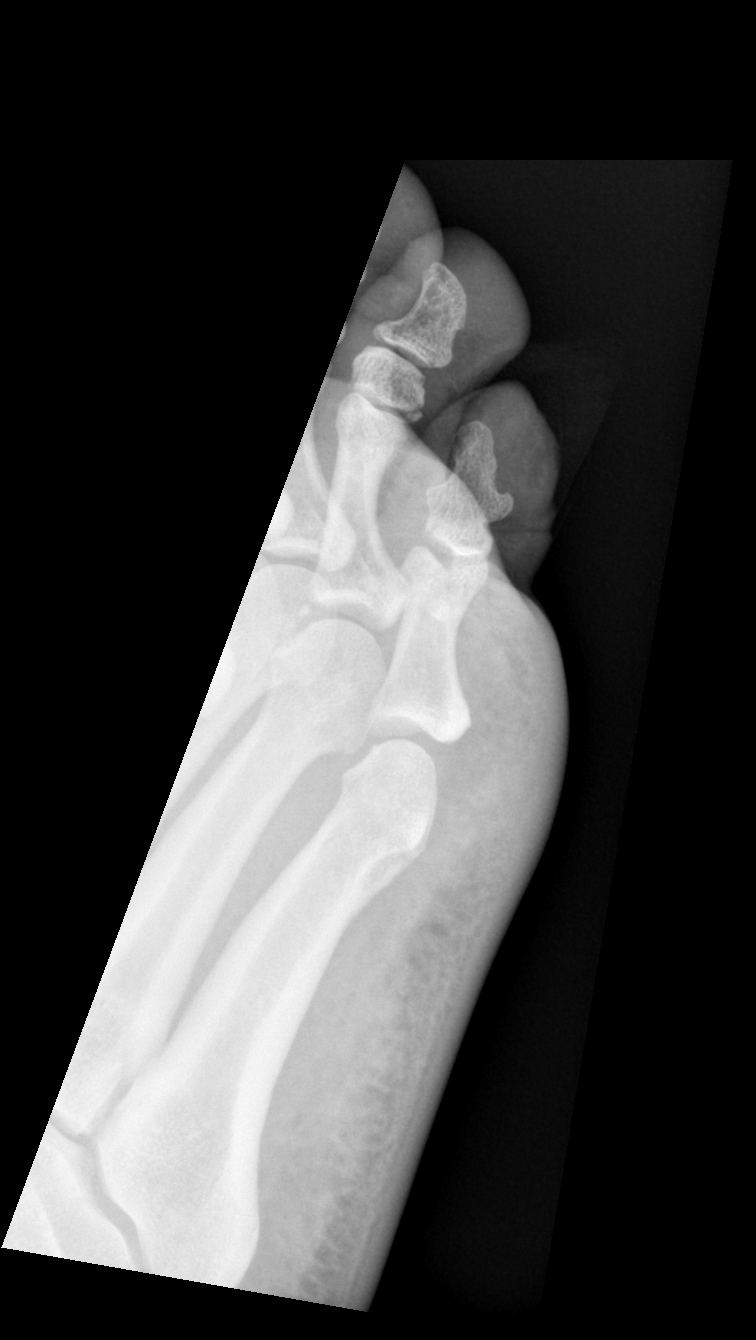
[im 2/3]
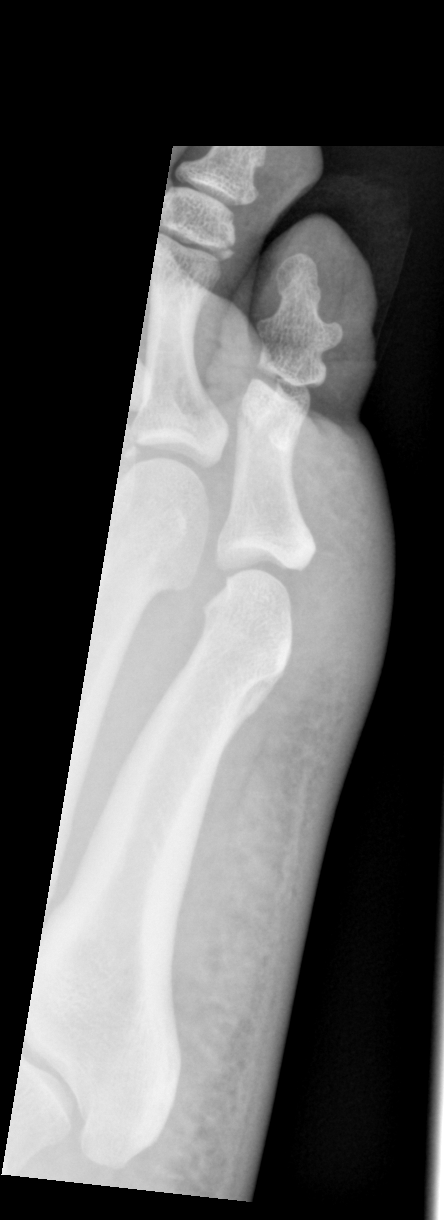
[im 3/3]
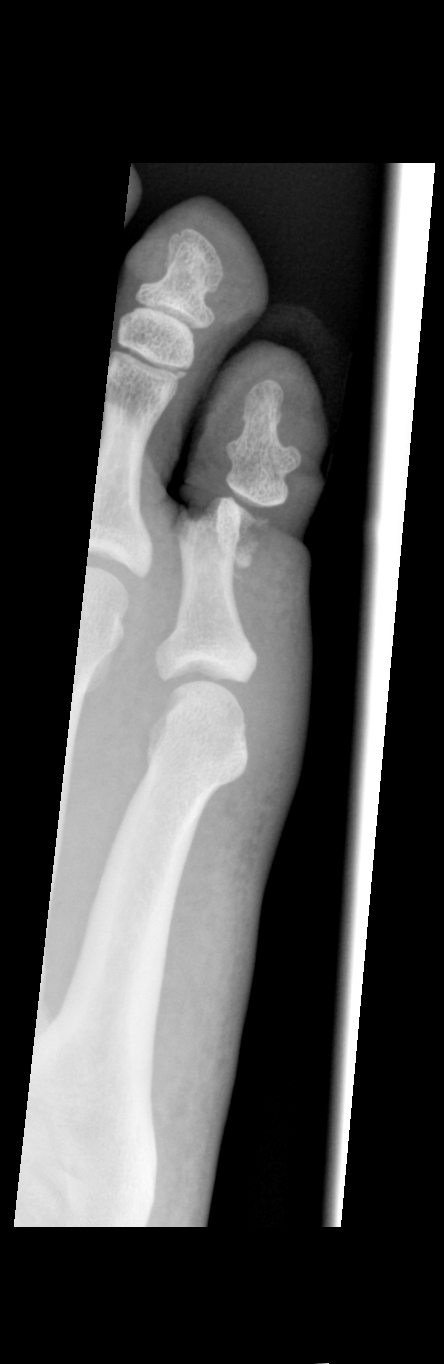

[3 of 3 positions shown; findings below may reference images not displayed]

FINDINGS: There is significant improvement in alignment of comminuted fracture
involving the distal portion of the fifth proximal phalanx compared
to prior exam, although significant dislocation remains. Due to the
limited projections obtain, it is difficult to ascertain whether the
fracture fragment is dorsal or plantar to the proximal fracture
fragments.
IMPRESSION: Improved alignment of fracture involving fifth proximal phalanx
compared to prior exam, although significant dislocation remains.
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
## 2016-04-19 IMAGING — CR DG CHEST 2V
2 series · 2 of 2 positions shown · non-contrast
Comparison: None.

CLINICAL DATA: Preoperative evaluation for right foot surgery new
onset cough

EXAM:
CHEST  2 VIEW

[w chest lat]
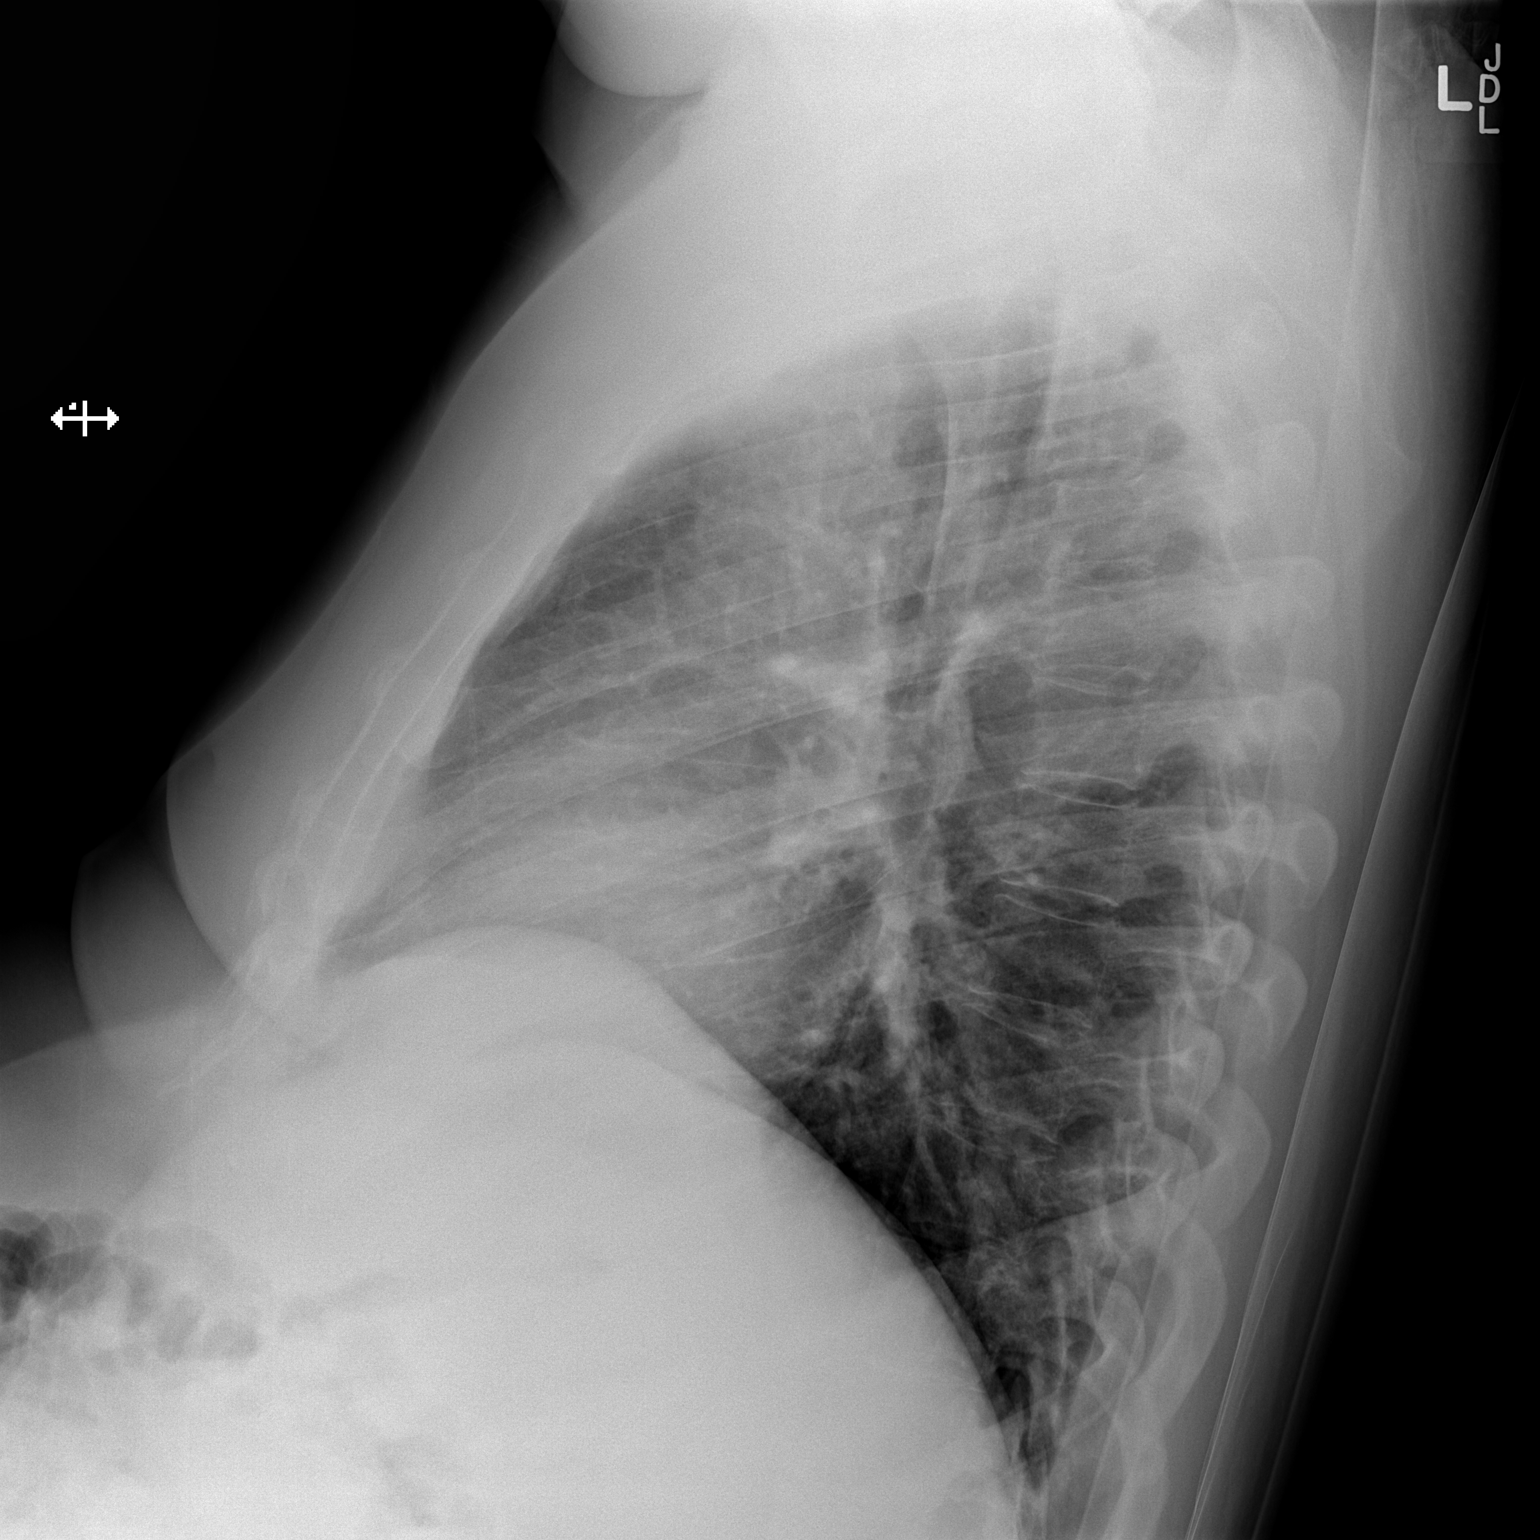

[x chest ap]
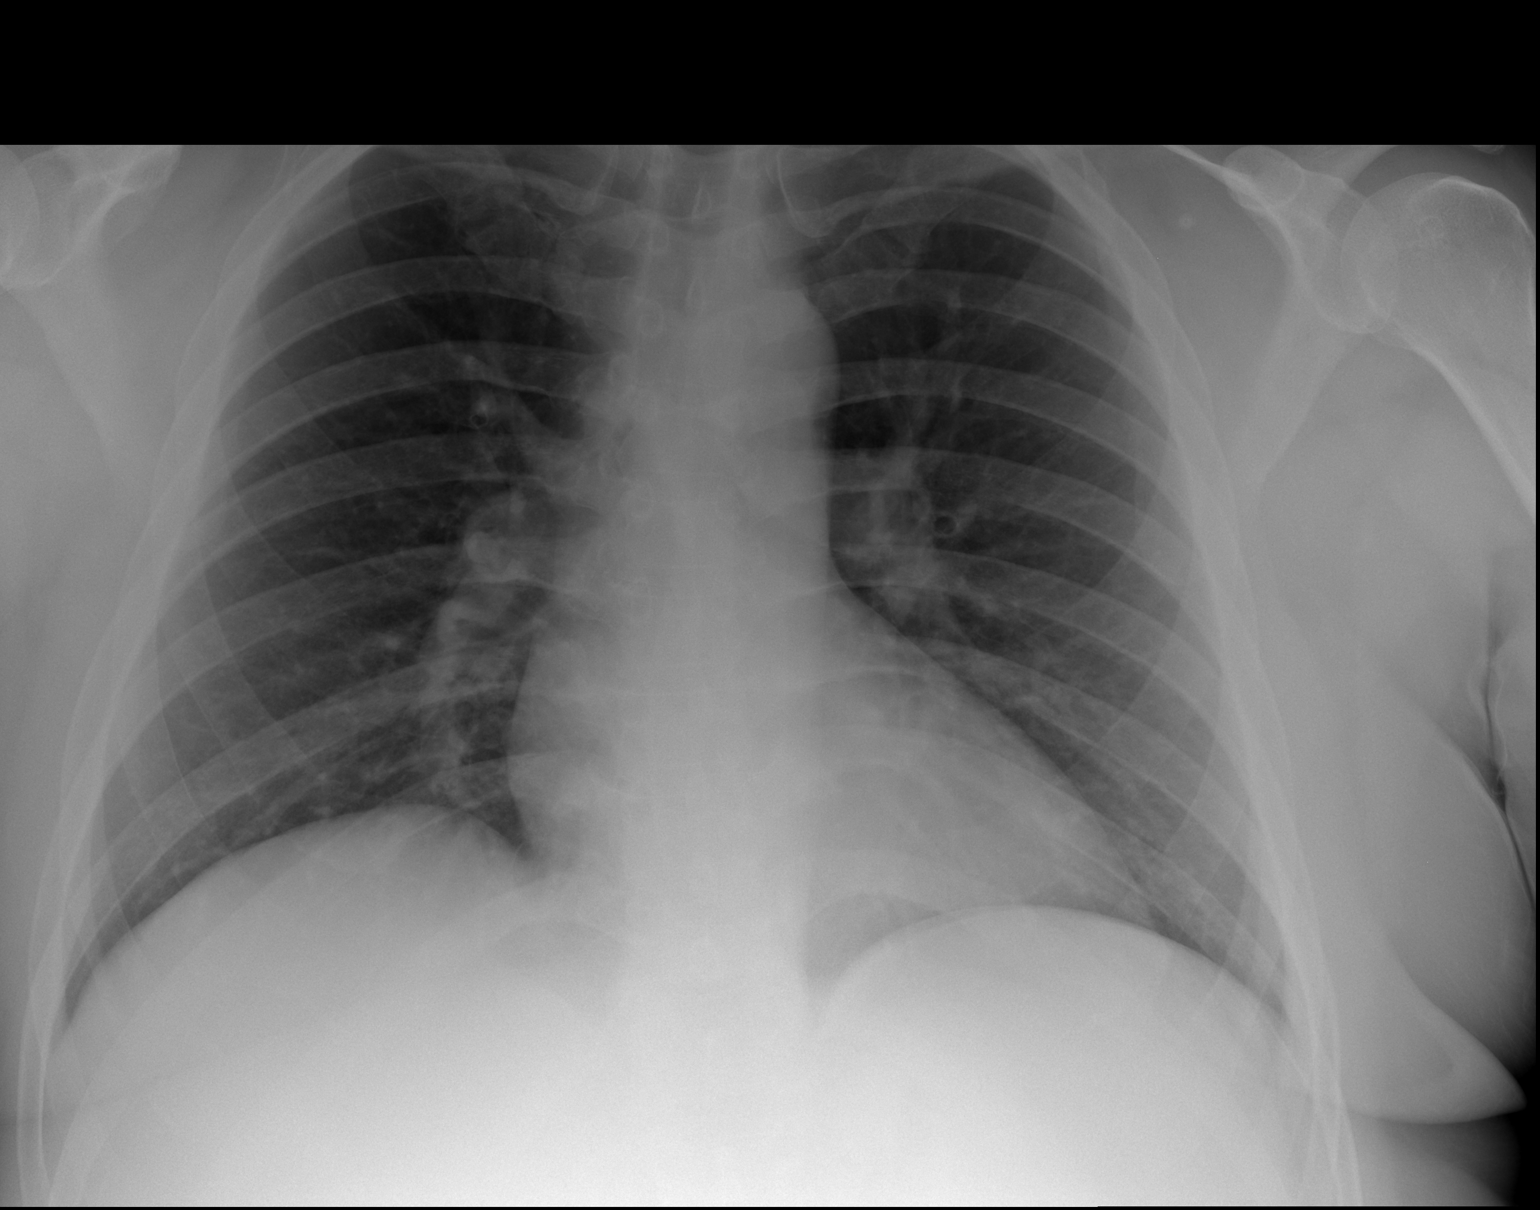

[2 of 2 positions shown; findings below may reference images not displayed]

FINDINGS: The heart size and mediastinal contours are within normal limits.
Both lungs are clear. The visualized skeletal structures are
unremarkable.
IMPRESSION: No active cardiopulmonary disease.

## 2017-01-17 DIAGNOSIS — E7800 Pure hypercholesterolemia, unspecified: Secondary | ICD-10-CM | POA: Diagnosis not present

## 2017-01-17 DIAGNOSIS — Z125 Encounter for screening for malignant neoplasm of prostate: Secondary | ICD-10-CM | POA: Diagnosis not present

## 2017-01-17 DIAGNOSIS — Z0000 Encounter for general adult medical examination without abnormal findings: Secondary | ICD-10-CM | POA: Diagnosis not present

## 2017-01-17 DIAGNOSIS — E119 Type 2 diabetes mellitus without complications: Secondary | ICD-10-CM | POA: Diagnosis not present

## 2017-03-21 ENCOUNTER — Telehealth (INDEPENDENT_AMBULATORY_CARE_PROVIDER_SITE_OTHER): Payer: Self-pay | Admitting: *Deleted

## 2017-03-21 NOTE — Telephone Encounter (Signed)
Pt came in needing a RX for [employer] [phone] FAX: [phone] (2) new silicone liners (6) multiply socks (6) 1 ply socks  asking for a call when this is ready for him to come back to pick up.

## 2017-03-22 NOTE — Telephone Encounter (Signed)
I called and left voicemail for the patient this morning. This was faxed to [employer] and original at front desk for pick up.

## 2017-03-29 ENCOUNTER — Telehealth (INDEPENDENT_AMBULATORY_CARE_PROVIDER_SITE_OTHER): Payer: Self-pay | Admitting: Radiology

## 2017-03-29 NOTE — Telephone Encounter (Signed)
[facility] new shower chair. Order written and signed and faxed to [place] at [phone].

## 2017-04-15 DIAGNOSIS — E119 Type 2 diabetes mellitus without complications: Secondary | ICD-10-CM | POA: Diagnosis not present

## 2017-04-15 DIAGNOSIS — H40033 Anatomical narrow angle, bilateral: Secondary | ICD-10-CM | POA: Diagnosis not present

## 2017-04-16 DIAGNOSIS — H43393 Other vitreous opacities, bilateral: Secondary | ICD-10-CM | POA: Diagnosis not present

## 2017-04-20 DIAGNOSIS — E119 Type 2 diabetes mellitus without complications: Secondary | ICD-10-CM | POA: Diagnosis not present

## 2017-08-08 ENCOUNTER — Encounter (INDEPENDENT_AMBULATORY_CARE_PROVIDER_SITE_OTHER): Payer: Self-pay | Admitting: Orthopedic Surgery

## 2017-08-08 ENCOUNTER — Ambulatory Visit (INDEPENDENT_AMBULATORY_CARE_PROVIDER_SITE_OTHER): Payer: Worker's Compensation | Admitting: Orthopedic Surgery

## 2017-08-08 ENCOUNTER — Ambulatory Visit (INDEPENDENT_AMBULATORY_CARE_PROVIDER_SITE_OTHER): Payer: Worker's Compensation

## 2017-08-08 DIAGNOSIS — M545 Low back pain: Secondary | ICD-10-CM | POA: Diagnosis not present

## 2017-08-08 DIAGNOSIS — M25562 Pain in left knee: Secondary | ICD-10-CM

## 2017-08-08 MED ORDER — LIDOCAINE HCL 1 % IJ SOLN
5.0000 mL | INTRAMUSCULAR | Status: AC | PRN
  Administered 2017-08-08: 5 mL

## 2017-08-08 MED ORDER — METHYLPREDNISOLONE ACETATE 40 MG/ML IJ SUSP
40.0000 mg | INTRAMUSCULAR | Status: AC | PRN
  Administered 2017-08-08: 40 mg via INTRA_ARTICULAR

## 2017-08-08 NOTE — Progress Notes (Signed)
Office Visit Note   Patient: Marc Taylor           Date of Birth: [birth year]           MRN: [id number] Visit Date: 08/08/2017              Requested by: Blair Heys, MD [address] [place], [place] [address] PCP: Blair Heys, MD  Chief Complaint  Patient presents with  . Lower Back - Pain  . Left Knee - Pain      HPI: Patient is a 56 year old gentleman status post right transtibial amputation who has been having some ulceration blistering over the patella tendon secondary to subsidence in his residual limb. Patient complains of medial and lateral joint line pain of the left knee as well as lower back pain without radicular symptoms.  Assessment & Plan: Visit Diagnoses:  1. Left knee pain, unspecified chronicity   2. Low back pain, unspecified back pain laterality, unspecified chronicity, with sciatica presence unspecified     Plan: Recommended continuing with the increased ply sock on the right to unload the patella tendon. Recommended abdominal back exercises for the lower back pain as well as the knee. Recommended exercise for the left knee as well recommend against using a knee brace.  Follow-Up Instructions: Return if symptoms worsen or fail to improve.   Ortho Exam  Patient is alert, oriented, no adenopathy, well-dressed, normal affect, normal respiratory effort. Examination patient has an antalgic gait. He has a superficial abrasion over the inferior pole the patella secondary to subsidence in his residual limb there is no cellulitis no open wound or signs of infection. Examination the left knee he is tender to palpation of the medial and lateral joint line Clausen cruciate are stable there is no effusion. Examination lumbar spine he has no radicular symptoms negative straight leg raise bilaterally and no focal motor weakness in either lower extremity.  Imaging: Xr Knee 1-2 Views Left  Result Date: 08/08/2017 2 view radiographs of the left knee  shows some periarticular bone spurs on the patella some mild joint space narrowing. No periarticular bone cysts  Xr Lumbar Spine 2-3 Views  Result Date: 08/08/2017 2 view radiographs of lumbar spine shows some mild joint space narrowing as well as some small periarticular bone spurs. No stress fracture  no spondylolisthesis.  No images are attached to the encounter.  Labs: Lab Results  Component Value Date   HGBA1C 13.6 (H) 08/25/2013   HGBA1C 14.0 (H) 08/25/2013   HGBA1C 6.1 (H) 01/08/2013    Orders:  Orders Placed This Encounter  Procedures  . XR Knee 1-2 Views Left  . XR Lumbar Spine 2-3 Views   No orders of the defined types were placed in this encounter.    Procedures: Large Joint Inj Date/Time: 08/08/2017 11:31 AM Performed by: ,  V Authorized by: [name]   Consent Given by:  Patient Site marked: the procedure site was marked   Timeout: prior to procedure the correct patient, procedure, and site was verified   Indications:  Pain and diagnostic evaluation Location:  Knee Site:  L knee Prep: patient was prepped and draped in usual sterile fashion   Needle Size:  22 G Needle Length:  1.5 inches Approach:  Anteromedial Ultrasound Guidance: No   Fluoroscopic Guidance: No   Arthrogram: No   Medications:  5 mL lidocaine 1 %; 40 mg methylPREDNISolone acetate 40 MG/ML Aspiration Attempted: No   Patient tolerance:  Patient tolerated  the procedure well with no immediate complications    Clinical Data: No additional findings.  ROS:  All other systems negative, except as noted in the HPI. Review of Systems  Objective: Vital Signs: There were no vitals taken for this visit.  Specialty Comments:  No specialty comments available.  PMFS History: Patient Active Problem List   Diagnosis Date Noted  . Leg wound, right 10/23/2014  . Gas gangrene of foot (HCC) 09/18/2014  . Compartment syndrome of right foot (HCC) 09/04/2014  . Fracture of fifth  toe, right, closed 09/04/2014  . Compartment syndrome of foot (HCC) 09/04/2014  . Diabetes type 2, uncontrolled (HCC) 08/24/2013  . HTN (hypertension) 08/24/2013  . Hyperglycemia 08/24/2013   Past Medical History:  Diagnosis Date  . Compartment syndrome of right foot (HCC) 09/04/2014  . Constipation   . Diabetes mellitus without complication (HCC)   . Fracture of fifth toe, right, closed 09/04/2014  . History of blood transfusion 1963   "one; I had yellow jaundice" (08/24/2013)  . Hypertension   . Type II diabetes mellitus (HCC)    "just dx'd" (08/24/2013)    Family History  Problem Relation Age of Onset  . Cancer Mother   . Liver disease Mother   . Heart attack Mother   . Diabetes type II Father   . Hypertension Father   . Diabetes Father   . Peripheral vascular disease Father        amputation  . Diabetes type II Sister   . Hypertension Sister     Past Surgical History:  Procedure Laterality Date  . AMPUTATION Right 09/18/2014   transmetatarsal         Dr [name]  . AMPUTATION Right 09/18/2014   Procedure: Right Midfoot Amputation;  Surgeon: [name], MD;  Location: [facility] OR;  Service: Orthopedics;  Laterality: Right;  . AMPUTATION Right 09/19/2014   Procedure: AMPUTATION BELOW KNEE RIGHT;  Surgeon: [name], MD;  Location: [facility] OR;  Service: Orthopedics;  Laterality: Right;  . FASCIOTOMY Right 09/04/2014   Procedure: Right Foot FASCIOTOMY and wound vac placement;  Surgeon: [name], MD;  Location: [facility] OR;  Service: Orthopedics;  Laterality: Right;  . I&D EXTREMITY Right 09/06/2014   Procedure: Irrigation and Debridement Right Foot, TheraSkin graft and wound vac change out;  Surgeon: [name], MD;  Location: [facility] OR;  Service: Orthopedics;  Laterality: Right;  . OPEN REDUCTION INTERNAL FIXATION (ORIF) FOOT LISFRANC FRACTURE Right 09/04/2014   Procedure: OPEN REDUCTION INTERNAL FIXATION (ORIF) FOOT LISFRANC FRACTURE;  Surgeon: [name], MD;  Location: [facility]  OR;  Service: Orthopedics;  Laterality: Right;  . STUMP REVISION Right 11/01/2014   Procedure: Revision Below Knee Amputation;  Surgeon: [name], MD;  Location: [facility] OR;  Service: Orthopedics;  Laterality: Right;   Social History   Occupational History  . Not on file.   Social History Main Topics  . Smoking status: Never Smoker  . Smokeless tobacco: Never Used  . Alcohol use Yes     Comment: on occasioan   . Drug use: No  . Sexual activity: Yes

## 2017-08-10 ENCOUNTER — Telehealth (INDEPENDENT_AMBULATORY_CARE_PROVIDER_SITE_OTHER): Payer: Self-pay

## 2017-08-10 NOTE — Telephone Encounter (Signed)
Received fax from [facility] requesting 08/08/17 office note. Faxed to [phone].

## 2017-08-17 ENCOUNTER — Telehealth (INDEPENDENT_AMBULATORY_CARE_PROVIDER_SITE_OTHER): Payer: Self-pay

## 2017-08-17 NOTE — Telephone Encounter (Signed)
Faxed the 08/08/17 office note to [employer] ([facility]) per her request. Did not receive my previous fax.

## 2017-11-18 DIAGNOSIS — E7800 Pure hypercholesterolemia, unspecified: Secondary | ICD-10-CM | POA: Diagnosis not present

## 2017-11-18 DIAGNOSIS — Z209 Contact with and (suspected) exposure to unspecified communicable disease: Secondary | ICD-10-CM | POA: Diagnosis not present

## 2017-11-18 DIAGNOSIS — I10 Essential (primary) hypertension: Secondary | ICD-10-CM | POA: Diagnosis not present

## 2017-11-18 DIAGNOSIS — E119 Type 2 diabetes mellitus without complications: Secondary | ICD-10-CM | POA: Diagnosis not present

## 2018-01-09 ENCOUNTER — Telehealth (INDEPENDENT_AMBULATORY_CARE_PROVIDER_SITE_OTHER): Payer: Self-pay | Admitting: Orthopedic Surgery

## 2018-01-09 NOTE — Telephone Encounter (Signed)
Patient requesting socket for prosthesis and socks. Please advise before 5 # [phone]   After 5 # [phone]

## 2018-01-10 ENCOUNTER — Other Ambulatory Visit (INDEPENDENT_AMBULATORY_CARE_PROVIDER_SITE_OTHER): Payer: Self-pay

## 2018-01-10 NOTE — Telephone Encounter (Signed)
Called pt and he is requesting an order for [employer]otech. This was written he would like to pick this up today. Advised can pick up at 1pm will hold for Dr. Lajoyce Cornersuda to sign.

## 2018-01-19 ENCOUNTER — Telehealth (INDEPENDENT_AMBULATORY_CARE_PROVIDER_SITE_OTHER): Payer: Self-pay | Admitting: Orthopedic Surgery

## 2018-01-19 NOTE — Telephone Encounter (Signed)
08/08/17 ov note [facility]ed to [facility]. They received Rx on 2/26 and requested ov noted for that DOS, But I faxed advising them that patient not seen that day only the Rx was written

## 2018-01-23 ENCOUNTER — Telehealth (INDEPENDENT_AMBULATORY_CARE_PROVIDER_SITE_OTHER): Payer: Self-pay

## 2018-01-23 NOTE — Telephone Encounter (Signed)
Talked with patient and advised him of message per [name].  Patient has appt. Scheduled for Wednesday 01/25/18.

## 2018-01-23 NOTE — Telephone Encounter (Signed)
Pt has not been in the office since 07/2017 needs office visit please call thanks.

## 2018-01-23 NOTE — Telephone Encounter (Signed)
Patient called stating that he just started having right hip pain and would like to know what can be done for the pain.  Cb# is [phone].  Please advise.  Thank You.

## 2018-01-25 ENCOUNTER — Ambulatory Visit (INDEPENDENT_AMBULATORY_CARE_PROVIDER_SITE_OTHER): Payer: Self-pay | Admitting: Orthopedic Surgery

## 2018-01-30 ENCOUNTER — Ambulatory Visit (INDEPENDENT_AMBULATORY_CARE_PROVIDER_SITE_OTHER): Payer: Worker's Compensation

## 2018-01-30 ENCOUNTER — Ambulatory Visit (INDEPENDENT_AMBULATORY_CARE_PROVIDER_SITE_OTHER): Payer: Worker's Compensation | Admitting: Orthopedic Surgery

## 2018-01-30 ENCOUNTER — Encounter (INDEPENDENT_AMBULATORY_CARE_PROVIDER_SITE_OTHER): Payer: Self-pay | Admitting: Orthopedic Surgery

## 2018-01-30 DIAGNOSIS — M25551 Pain in right hip: Secondary | ICD-10-CM

## 2018-01-30 DIAGNOSIS — L97211 Non-pressure chronic ulcer of right calf limited to breakdown of skin: Secondary | ICD-10-CM | POA: Diagnosis not present

## 2018-01-30 DIAGNOSIS — Z89511 Acquired absence of right leg below knee: Secondary | ICD-10-CM | POA: Insufficient documentation

## 2018-01-30 DIAGNOSIS — M5410 Radiculopathy, site unspecified: Secondary | ICD-10-CM

## 2018-01-30 MED ORDER — PREDNISONE 10 MG PO TABS: 20.0000 mg | ORAL_TABLET | Freq: Every day | ORAL | 0 refills | Status: AC

## 2018-01-30 NOTE — Progress Notes (Signed)
Office Visit Note   Patient: Marc Taylor           Date of Birth: May 11, 1961           MRN: [id number] Visit Date: 01/30/2018              Requested by: Blair Heys, MD [address] [place], [place] [address] PCP: Blair Heys, MD  Chief Complaint  Patient presents with  . Right Hip - Pain  . Right Leg - Pain    11/01/14 right revision BKA  . Lower Back - Pain      HPI: Patient is a 57 year old gentleman with a right transtibial amputation who has maceration ulceration from sweating with his prosthetic socket liner.  Patient states that they have tried different socket liners without resolution.  Patient also complains of lumbar spine pain that radiates into the groin and down the lateral aspect of his right leg.  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  Assessment & Plan: Visit Diagnoses:  1. Pain in right hip   2. Radicular pain of right lower extremity   3. Acquired absence of right leg below knee (HCC)   4. Non-pressure chronic ulcer of right calf, limited to breakdown of skin (HCC)     Plan: Patient will follow up with biotech to obtain the wall alpacas stump shrinker he will wear this against the skin folded down so a portion of the silicone liner is attached to skin so his leg will not slip off.  We will take him out of work for 3 months to see if we can resolve his symptoms.  A prescription for prednisone is called in as well.  Follow-Up Instructions: Return in about 4 weeks (around 02/27/2018).   Ortho Exam  Patient is alert, oriented, no adenopathy, well-dressed, normal affect, normal respiratory effort. Examination patient has no pain with range of motion of the hip or knee on the right side.  He has maceration and blistering on the posterior aspect of the right calf secondary to the silicone liner.  Patient has no focal weakness on the right side.  There is no and bearing  pressure ulcers.  Imaging: Xr Hip Unilat W Or W/o Pelvis 2-3 Views Right  Result Date: 01/30/2018 2 view radiographs of the right hip shows no evidence of fracture no joint space narrowing no hip arthritis.  No images are attached to the encounter.  Labs: Lab Results  Component Value Date   HGBA1C 13.6 (H) 08/25/2013   HGBA1C 14.0 (H) 08/25/2013   HGBA1C 6.1 (H) 01/08/2013    @LABSALLVALUES (HGBA1)@  There is no height or weight on file to calculate BMI.  Orders:  Orders Placed This Encounter  Procedures  . XR HIP UNILAT W OR W/O PELVIS 2-3 VIEWS RIGHT  . XR Lumbar Spine 2-3 Views   Meds ordered this encounter  Medications  . predniSONE (DELTASONE) 10 MG tablet    Sig: Take 2 tablets (20 mg total) by mouth daily with breakfast.    Dispense:  60 tablet    Refill:  0     Procedures: No procedures performed  Clinical Data: No additional findings.  ROS:  All other systems negative, except as noted in the HPI. Review of Systems  Objective: Vital Signs: There were no vitals taken for this visit.  Specialty Comments:  No specialty comments available.  PMFS History: Patient Active Problem List  Diagnosis Date Noted  . Non-pressure chronic ulcer of right calf, limited to breakdown of skin (HCC) 01/30/2018  . Acquired absence of right leg below knee (HCC) 01/30/2018  . Radicular pain of right lower extremity 01/30/2018  . Pain in right hip 01/30/2018  . Leg wound, right 10/23/2014  . Gas gangrene of foot (HCC) 09/18/2014  . Compartment syndrome of right foot (HCC) 09/04/2014  . Fracture of fifth toe, right, closed 09/04/2014  . Compartment syndrome of foot (HCC) 09/04/2014  . Diabetes type 2, uncontrolled (HCC) 08/24/2013  . HTN (hypertension) 08/24/2013  . Hyperglycemia 08/24/2013   Past Medical History:  Diagnosis Date  . Compartment syndrome of right foot (HCC) 09/04/2014  . Constipation   . Diabetes mellitus without complication (HCC)   . Fracture of  fifth toe, right, closed 09/04/2014  . History of blood transfusion 1963   "one; I had yellow jaundice" (08/24/2013)  . Hypertension   . Type II diabetes mellitus (HCC)    "just dx'd" (08/24/2013)    Family History  Problem Relation Age of Onset  . Cancer Mother   . Liver disease Mother   . Heart attack Mother   . Diabetes type II Father   . Hypertension Father   . Diabetes Father   . Peripheral vascular disease Father        amputation  . Diabetes type II Sister   . Hypertension Sister     Past Surgical History:  Procedure Laterality Date  . AMPUTATION Right 09/18/2014   transmetatarsal         [name]  . AMPUTATION Right 09/18/2014   Procedure: Right Midfoot Amputation;  Surgeon: [name], MD;  [facility];  Service: Orthopedics;  Laterality: Right;  . AMPUTATION Right 09/19/2014   Procedure: AMPUTATION BELOW KNEE RIGHT;  Surgeon: [name], MD;  Location: [facility] OR;  Service: Orthopedics;  Laterality: Right;  . FASCIOTOMY Right 09/04/2014   Procedure: Right Foot FASCIOTOMY and wound vac placement;  Surgeon: [name], MD;  [facility];  Service: Orthopedics;  Laterality: Right;  . I&D EXTREMITY Right 09/06/2014   Procedure: Irrigation and Debridement Right Foot, TheraSkin graft and wound vac change out;  Surgeon: [name], MD;  Location: [facility] OR;  Service: Orthopedics;  Laterality: Right;  . OPEN REDUCTION INTERNAL FIXATION (ORIF) FOOT LISFRANC FRACTURE Right 09/04/2014   Procedure: OPEN REDUCTION INTERNAL FIXATION (ORIF) FOOT LISFRANC FRACTURE;  Surgeon: [name], MD;  Location: [facility] OR;  Service: Orthopedics;  Laterality: Right;  . STUMP REVISION Right 11/01/2014   Procedure: Revision Below Knee Amputation;  Surgeon: [name], MD;  [facility];  Service: Orthopedics;  Laterality: Right;   Social History   Occupational History  . Not on file  Tobacco Use  . Smoking status: Never Smoker  . Smokeless tobacco: Never Used  Substance and  Sexual Activity  . Alcohol use: Yes    Comment: on occasioan   . Drug use: No  . Sexual activity: Yes

## 2018-02-07 ENCOUNTER — Telehealth (INDEPENDENT_AMBULATORY_CARE_PROVIDER_SITE_OTHER): Payer: Self-pay

## 2018-02-07 NOTE — Telephone Encounter (Signed)
Faxed the 01/30/18 office note to the adj per her request

## 2018-03-02 ENCOUNTER — Encounter (INDEPENDENT_AMBULATORY_CARE_PROVIDER_SITE_OTHER): Payer: Self-pay | Admitting: Orthopedic Surgery

## 2018-03-02 ENCOUNTER — Ambulatory Visit (INDEPENDENT_AMBULATORY_CARE_PROVIDER_SITE_OTHER): Payer: Worker's Compensation | Admitting: Orthopedic Surgery

## 2018-03-02 VITALS — Ht 69.0 in | Wt 249.0 lb

## 2018-03-02 DIAGNOSIS — M5410 Radiculopathy, site unspecified: Secondary | ICD-10-CM

## 2018-03-02 DIAGNOSIS — L97211 Non-pressure chronic ulcer of right calf limited to breakdown of skin: Secondary | ICD-10-CM | POA: Diagnosis not present

## 2018-03-02 DIAGNOSIS — M545 Low back pain: Secondary | ICD-10-CM | POA: Diagnosis not present

## 2018-03-02 NOTE — Progress Notes (Signed)
Office Visit Note   Patient: Marc Taylor           Date of Birth: 1961/02/03           MRN: [id number] Visit Date: 03/02/2018              Requested by: Blair Heys, MD [address] [place], [place] [address] PCP: Blair Heys, MD  Chief Complaint  Patient presents with  . Right Leg - Follow-up    Hx BKA revision 11/01/14 and right hip pain      HPI: Patient is a 57 year old gentleman who presents he is status post a right transtibial amputation in December 2015 with revision.  Patient has been having some chronic lower back pain with occasional pain radiating down the right lower extremity patient was previously given a prescription for prednisone for the radicular symptoms patient states that this was not field due to confusion with Workmen's Comp.  This has been approved and his workman case manager will work with him to resolve this issue.  Patient states he has recurrent episodes of blistering from his liner for the right lower extremity and due to these recurrent blistering and lower back pain patient feels like he cannot return to work.  Assessment & Plan: Visit Diagnoses:  1. Radicular pain of right lower extremity   2. Non-pressure chronic ulcer of right calf, limited to breakdown of skin (HCC)   3. Low back pain, unspecified back pain laterality, unspecified chronicity, with sciatica presence unspecified     Plan: Patient will be set up for a functional capacity evaluation.  He will go ahead and get the prednisone filled.  Discussed that with his diabetes and morbid obesity he needs to aggressively work on weight reduction which includes greens beans nuts and fruit.  Patient is concerned that he cannot take this diet with diabetes and I discussed the critical nature of using this diet to improve his diabetes.  Follow-Up Instructions: Return if symptoms worsen or fail to improve.   Ortho Exam  Patient is alert, oriented, no adenopathy, well-dressed,  normal affect, normal respiratory effort. Examination patient has a slight antalgic gait he walks well with his prosthesis he has a negative straight leg raise he has no focal motor weakness in either lower extremity.  Patient does have some healing blisters on the top aspect of his thigh which appears to be from his old prosthetic liner which was causing shear on the skin he has a new liner with no new blisters.  Patient's diabetes is poorly controlled with the most recent hemoglobin A1c in the chart of approximately 14.  Patient's most recent body mass index is approximately 37.  After examination patient was seen with his workman Horticulturist, commercial and all questions were answered prescription provided for FCE.  Workmen's Comp. Psychologist, sport and exercise.  Imaging: No results found. No images are attached to the encounter.  Labs: Lab Results  Component Value Date   HGBA1C 13.6 (H) 08/25/2013   HGBA1C 14.0 (H) 08/25/2013   HGBA1C 6.1 (H) 01/08/2013    @LABSALLVALUES (HGBA1)@  Body mass index is 36.77 kg/m.  Orders:  No orders of the defined types were placed in this encounter.  No orders of the defined types were placed in this encounter.    Procedures: No procedures performed  Clinical Data: No additional findings.  ROS:  All other systems negative, except as noted in the HPI. Review of Systems  Objective: Vital Signs: Ht 5\' 9"  (  1.753 m)   Wt 249 lb (112.9 kg)   BMI 36.77 kg/m   Specialty Comments:  No specialty comments available.  PMFS History: Patient Active Problem List   Diagnosis Date Noted  . Non-pressure chronic ulcer of right calf, limited to breakdown of skin (HCC) 01/30/2018  . Acquired absence of right leg below knee (HCC) 01/30/2018  . Radicular pain of right lower extremity 01/30/2018  . Pain in right hip 01/30/2018  . Leg wound, right 10/23/2014  . Gas gangrene of foot (HCC) 09/18/2014  . Compartment syndrome of right foot (HCC) 09/04/2014  . Fracture  of fifth toe, right, closed 09/04/2014  . Compartment syndrome of foot (HCC) 09/04/2014  . Diabetes type 2, uncontrolled (HCC) 08/24/2013  . HTN (hypertension) 08/24/2013  . Hyperglycemia 08/24/2013   Past Medical History:  Diagnosis Date  . Compartment syndrome of right foot (HCC) 09/04/2014  . Constipation   . Diabetes mellitus without complication (HCC)   . Fracture of fifth toe, right, closed 09/04/2014  . History of blood transfusion 1963   "one; I had yellow jaundice" (08/24/2013)  . Hypertension   . Type II diabetes mellitus (HCC)    "just dx'd" (08/24/2013)    Family History  Problem Relation Age of Onset  . Cancer Mother   . Liver disease Mother   . Heart attack Mother   . Diabetes type II Father   . Hypertension Father   . Diabetes Father   . Peripheral vascular disease Father        amputation  . Diabetes type II Sister   . Hypertension Sister     Past Surgical History:  Procedure Laterality Date  . AMPUTATION Right 09/18/2014   transmetatarsal         [name]  . AMPUTATION Right 09/18/2014   Procedure: Right Midfoot Amputation;  Surgeon: Nadara MustardMarcus Bradlee Bridgers V, MD;  [facility];  Service: Orthopedics;  Laterality: Right;  . AMPUTATION Right 09/19/2014   Procedure: AMPUTATION BELOW KNEE RIGHT;  Surgeon: Nadara MustardMarcus Daion Ginsberg V, MD;  Location: [facility] OR;  Service: Orthopedics;  Laterality: Right;  . FASCIOTOMY Right 09/04/2014   Procedure: Right Foot FASCIOTOMY and wound vac placement;  Surgeon: [name], MD;  [facility];  Service: Orthopedics;  Laterality: Right;  . I&D EXTREMITY Right 09/06/2014   Procedure: Irrigation and Debridement Right Foot, TheraSkin graft and wound vac change out;  Surgeon: Nadara MustardMarcus Mindel Friscia V, MD;  Location: [facility] OR;  Service: Orthopedics;  Laterality: Right;  . OPEN REDUCTION INTERNAL FIXATION (ORIF) FOOT LISFRANC FRACTURE Right 09/04/2014   Procedure: OPEN REDUCTION INTERNAL FIXATION (ORIF) FOOT LISFRANC FRACTURE;  Surgeon: [name], MD;   Location: [facility] OR;  Service: Orthopedics;  Laterality: Right;  . STUMP REVISION Right 11/01/2014   Procedure: Revision Below Knee Amputation;  Surgeon: Nadara MustardMarcus Adaley Kiene V, MD;  [facility];  Service: Orthopedics;  Laterality: Right;   Social History   Occupational History  . Not on file  Tobacco Use  . Smoking status: Never Smoker  . Smokeless tobacco: Never Used  Substance and Sexual Activity  . Alcohol use: Yes    Comment: on occasioan   . Drug use: No  . Sexual activity: Yes

## 2018-03-07 ENCOUNTER — Telehealth (INDEPENDENT_AMBULATORY_CARE_PROVIDER_SITE_OTHER): Payer: Self-pay

## 2018-03-07 NOTE — Telephone Encounter (Signed)
Emailed case manager the 03/02/18 office note per her request

## 2018-04-04 ENCOUNTER — Encounter (INDEPENDENT_AMBULATORY_CARE_PROVIDER_SITE_OTHER): Payer: Self-pay | Admitting: Orthopedic Surgery

## 2018-04-04 ENCOUNTER — Ambulatory Visit (INDEPENDENT_AMBULATORY_CARE_PROVIDER_SITE_OTHER): Payer: Worker's Compensation | Admitting: Orthopedic Surgery

## 2018-04-04 VITALS — Ht 69.0 in | Wt 249.0 lb

## 2018-04-04 DIAGNOSIS — M5410 Radiculopathy, site unspecified: Secondary | ICD-10-CM

## 2018-04-04 DIAGNOSIS — Z89511 Acquired absence of right leg below knee: Secondary | ICD-10-CM | POA: Diagnosis not present

## 2018-04-04 DIAGNOSIS — L97211 Non-pressure chronic ulcer of right calf limited to breakdown of skin: Secondary | ICD-10-CM

## 2018-04-04 NOTE — Progress Notes (Signed)
Office Visit Note   Patient: Marc Taylor           Date of Birth: [birth year]           MRN: [id number] Visit Date: 04/04/2018              Requested by: Blair Heys, MD [address] [place], [place] [address] PCP: Blair Heys, MD  Chief Complaint  Patient presents with  . Right Leg - Follow-up    11/01/14 right BKA revision FCE review.       HPI: Patient is a 57 year old gentleman who was seen in follow-up status post a functional capacity evaluation.  By the functional capacity evaluation he is able to function at a light to medium physical demand level for 8 hours a day which means he could lift 25 pounds.  This does not address his being able to go up and down stairs.  With patient's current back pain and history of ulceration on his right transtibial amputation patient states he currently cannot sit or stand for prolonged periods of time and usually has to lay down due to his symptoms to resolve his chronic back symptoms.  He has tried SSRI as well as gabapentin medication without relief.  Patient did not demonstrate any symptom exaggeration behavior during his functional capacity evaluation.  Assessment & Plan: Visit Diagnoses:  1. Radicular pain of right lower extremity   2. Non-pressure chronic ulcer of right calf, limited to breakdown of skin (HCC)   3. Acquired absence of right leg below knee (HCC)     Plan: I feel the long-term patient is fully disabled from his current level of work.  I do not feel that long-term patient could function at a light to medium physical demand level and could not function at a sedentary level of work.  With patient's symptoms he would have to lay down and this would not be reasonable for his sedentary level of work.  The patient is medically disabled.  Follow-Up Instructions: Return if symptoms worsen or fail to improve.   Ortho Exam  Patient is alert, oriented, no adenopathy, well-dressed, normal affect, normal  respiratory effort. On examination patient has difficulty getting from a sitting to a standing position.  He does have an antalgic gait he has radicular symptoms down the right lower extremity MRI scan was not a ordered because patient states he is not interested and injections in his back and he is not interested in any type of lumbar spine surgery.  Patient has no focal motor weakness at this time.  After the examination patient was seen with his nurse case manager and the findings were reviewed.  Patient was given a note for permanent disability.  Imaging: No results found. No images are attached to the encounter.  Labs: Lab Results  Component Value Date   HGBA1C 13.6 (H) 08/25/2013   HGBA1C 14.0 (H) 08/25/2013   HGBA1C 6.1 (H) 01/08/2013     Lab Results  Component Value Date   ALBUMIN 2.8 (L) 09/18/2014   ALBUMIN 3.0 (L) 09/07/2014   ALBUMIN 3.1 (L) 09/06/2014    Body mass index is 36.77 kg/m.  Orders:  No orders of the defined types were placed in this encounter.  No orders of the defined types were placed in this encounter.    Procedures: No procedures performed  Clinical Data: No additional findings.  ROS:  All other systems negative, except as noted in the HPI. Review of Systems  Objective:  Vital Signs: Ht  (1.753 m)   Wt 249 lb (112.9 kg)   BMI 36.77 kg/m   Specialty Comments:  No specialty comments available.  PMFS History: Patient Active Problem List   Diagnosis Date Noted  . Non-pressure chronic ulcer of right calf, limited to breakdown of skin (HCC) 01/30/2018  . Acquired absence of right leg below knee (HCC) 01/30/2018  . Radicular pain of right lower extremity 01/30/2018  . Pain in right hip 01/30/2018  . Leg wound, right 10/23/2014  . Gas gangrene of foot (HCC) 09/18/2014  . Compartment syndrome of right foot (HCC) 09/04/2014  . Fracture of fifth toe, right, closed 09/04/2014  . Compartment syndrome of foot (HCC) 09/04/2014  .  Diabetes type 2, uncontrolled (HCC) 08/24/2013  . HTN (hypertension) 08/24/2013  . Hyperglycemia 08/24/2013   Past Medical History:  Diagnosis Date  . Compartment syndrome of right foot (HCC) 09/04/2014  . Constipation   . Diabetes mellitus without complication (HCC)   . Fracture of fifth toe, right, closed 09/04/2014  . History of blood transfusion 1963   "one; I had yellow jaundice" (08/24/2013)  . Hypertension   . Type II diabetes mellitus (HCC)    "just dx'd" (08/24/2013)    Family History  Problem Relation Age of Onset  . Cancer Mother   . Liver disease Mother   . Heart attack Mother   . Diabetes type II Father   . Hypertension Father   . Diabetes Father   . Peripheral vascular disease Father        amputation  . Diabetes type II Sister   . Hypertension Sister     Past Surgical History:  Procedure Laterality Date  . AMPUTATION Right 09/18/2014   transmetatarsal         Dr [name]  . AMPUTATION Right 09/18/2014   Procedure: Right Midfoot Amputation;  Surgeon: [name], MD;  Location[facility] OR;  Service: Orthopedics;  Laterality: Right;  . AMPUTATION Right 09/19/2014   Procedure: AMPUTATION BELOW KNEE RIGHT;  Surgeon: [name], MD;  Location: [facility] OR;  Service: Orthopedics;  Laterality: Right;  . FASCIOTOMY Right 09/04/2014   Procedure: Right Foot FASCIOTOMY and wound vac placement;  Surgeon: [name], MD;  Location[facility] OR;  Service: Orthopedics;  Laterality: Right;  . I&D EXTREMITY Right 09/06/2014   Procedure: Irrigation and Debridement Right Foot, TheraSkin graft and wound vac change out;  Surgeon: [name], MD;  Location: [facility] OR;  Service: Orthopedics;  Laterality: Right;  . OPEN REDUCTION INTERNAL FIXATION (ORIF) FOOT LISFRANC FRACTURE Right 09/04/2014   Procedure: OPEN REDUCTION INTERNAL FIXATION (ORIF) FOOT LISFRANC FRACTURE;  Surgeon: [name], MD;  Location: [facility] OR;  Service: Orthopedics;  Laterality: Right;  . STUMP REVISION Right 11/01/2014    Procedure: Revision Below Knee Amputation;  Surgeon: [name], MD;  Location: [facility] OR;  Service: Orthopedics;  Laterality: Right;   Social History   Occupational History  . Not on file  Tobacco Use  . Smoking status: Never Smoker  . Smokeless tobacco: Never Used  Substance and Sexual Activity  . Alcohol use: Yes    Comment: on occasioan   . Drug use: No  . Sexual activity: Yes

## 2018-04-06 ENCOUNTER — Telehealth (INDEPENDENT_AMBULATORY_CARE_PROVIDER_SITE_OTHER): Payer: Self-pay

## 2018-04-06 NOTE — Telephone Encounter (Signed)
Emailed the 04/04/18 office note to case mgr per her request

## 2018-05-12 ENCOUNTER — Telehealth (INDEPENDENT_AMBULATORY_CARE_PROVIDER_SITE_OTHER): Payer: Self-pay | Admitting: Orthopedic Surgery

## 2018-05-12 NOTE — Telephone Encounter (Signed)
Patient called requesting pain medication for his back.  CB#[phone].  Thank you.

## 2018-05-12 NOTE — Telephone Encounter (Signed)
Will hold for Dr. Thurmond Buttsuda monday

## 2018-05-15 ENCOUNTER — Other Ambulatory Visit (INDEPENDENT_AMBULATORY_CARE_PROVIDER_SITE_OTHER): Payer: Self-pay | Admitting: Orthopedic Surgery

## 2018-05-15 MED ORDER — OXYCODONE-ACETAMINOPHEN 5-325 MG PO TABS: 1.0000 | ORAL_TABLET | Freq: Three times a day (TID) | ORAL | 0 refills | Status: DC | PRN

## 2018-05-15 NOTE — Telephone Encounter (Signed)
Called pt to advise that rx is at the desk for pick up.  

## 2018-05-15 NOTE — Telephone Encounter (Signed)
Pt is requesting pain medication for his back pain.

## 2018-05-15 NOTE — Telephone Encounter (Signed)
rx written

## 2018-05-16 ENCOUNTER — Other Ambulatory Visit (INDEPENDENT_AMBULATORY_CARE_PROVIDER_SITE_OTHER): Payer: Self-pay

## 2018-05-16 MED ORDER — OXYCODONE-ACETAMINOPHEN 5-325 MG PO TABS: 1.0000 | ORAL_TABLET | Freq: Three times a day (TID) | ORAL | 0 refills | Status: AC | PRN

## 2018-05-17 ENCOUNTER — Telehealth (INDEPENDENT_AMBULATORY_CARE_PROVIDER_SITE_OTHER): Payer: Self-pay

## 2018-05-17 ENCOUNTER — Telehealth (INDEPENDENT_AMBULATORY_CARE_PROVIDER_SITE_OTHER): Payer: Self-pay | Admitting: Orthopedic Surgery

## 2018-05-17 NOTE — Telephone Encounter (Signed)
Marc DrossJohn Taylor from [employer] called stating that the patient took the prescription to the pharmacy, but this medication was denied by WC.  He wanted to know why the prescription was written by a different provider? Mr. Normand SloopDillard stated that [facility].  Marc's [id number]#[phone].  Thank you.

## 2018-05-17 NOTE — Telephone Encounter (Signed)
rx for Oxycondone 5/325 no prior auth is needed from case manager not from our office. To call with questions lm on vm

## 2018-05-19 NOTE — Telephone Encounter (Signed)
Please see messages below.

## 2018-05-19 NOTE — Telephone Encounter (Signed)
From documentation in chart, patient's attorney is PharmacologistDagett Shuler. There is not a release I can find for [employer]. I attempted to reach patient to let him know that message we received states WC denied medication, however, that is something that he would have to discuss with them, not something we can control. Also, the medication was sent in by Mardella LaymanLindsey who works in this office for Dr. Lajoyce Cornersuda.

## 2018-05-23 NOTE — Telephone Encounter (Signed)
Called pt and he advised he is with daget shular not [employer]. He states that WC denied rx because it was not written by Dr. Lajoyce Cornersuda . Pt will call WC and follow up with them to see if any other information is needed but the reason the rx was written by PA was due to DEA# showing as inactive for [facility] at [facility].

## 2018-06-01 DIAGNOSIS — I10 Essential (primary) hypertension: Secondary | ICD-10-CM | POA: Diagnosis not present

## 2018-06-01 DIAGNOSIS — E7800 Pure hypercholesterolemia, unspecified: Secondary | ICD-10-CM | POA: Diagnosis not present

## 2018-06-01 DIAGNOSIS — Z0000 Encounter for general adult medical examination without abnormal findings: Secondary | ICD-10-CM | POA: Diagnosis not present

## 2018-06-01 DIAGNOSIS — E119 Type 2 diabetes mellitus without complications: Secondary | ICD-10-CM | POA: Diagnosis not present

## 2018-09-04 DIAGNOSIS — E119 Type 2 diabetes mellitus without complications: Secondary | ICD-10-CM | POA: Diagnosis not present

## 2018-09-04 DIAGNOSIS — E1165 Type 2 diabetes mellitus with hyperglycemia: Secondary | ICD-10-CM | POA: Diagnosis not present

## 2018-09-04 DIAGNOSIS — H40033 Anatomical narrow angle, bilateral: Secondary | ICD-10-CM | POA: Diagnosis not present

## 2018-09-14 ENCOUNTER — Telehealth (INDEPENDENT_AMBULATORY_CARE_PROVIDER_SITE_OTHER): Payer: Self-pay | Admitting: Orthopedic Surgery

## 2018-09-14 NOTE — Telephone Encounter (Signed)
Patient called advised he spoke with [facility] and was told the pharmacy will not fill a controlled substance. Patient said he need his pain medicine refilled.  Patient said he uses the Statistician at [facility].  The number to contact patient is [phone]

## 2018-09-15 NOTE — Telephone Encounter (Signed)
Patient was called to schedule appt to be evaluated for pain medication refill. Upcoming appt set for 11/4 with Shawn.

## 2018-09-18 ENCOUNTER — Ambulatory Visit (INDEPENDENT_AMBULATORY_CARE_PROVIDER_SITE_OTHER): Payer: Self-pay | Admitting: Physician Assistant

## 2018-10-03 ENCOUNTER — Telehealth (INDEPENDENT_AMBULATORY_CARE_PROVIDER_SITE_OTHER): Payer: Self-pay

## 2018-10-03 NOTE — Telephone Encounter (Signed)
Dr Barnetta ChapelMark Watson with a medical review for disability called needing to discuss continued restrictions for this patient he asked for [name] to call him back at [phone]

## 2018-10-03 NOTE — Telephone Encounter (Signed)
I spoke to the physiatrist on the phone who is in [place] doing the functional capacity evaluation reviewed.  I feel the patient could do sedentary level of work for 6 hours a day changing positions every 20 minutes maximum lifting 10 pounds  no stooping no squatting no climbing stairs or ladders.  Discussed that with additional 2 hours of the day patient would need to be able to remove his prosthesis and would need to be able to be off his feet every 20 minutes to inspect the leg to ensure he is not developing any ulcerations.  Patient has had a history of recurrent ulcerations.

## 2019-06-25 ENCOUNTER — Other Ambulatory Visit: Payer: Self-pay

## 2019-06-25 DIAGNOSIS — Z20822 Contact with and (suspected) exposure to covid-19: Secondary | ICD-10-CM

## 2019-06-27 LAB — NOVEL CORONAVIRUS, NAA: SARS-CoV-2, NAA: NOT DETECTED

## 2019-11-27 ENCOUNTER — Telehealth: Payer: Self-pay

## 2019-11-27 NOTE — Telephone Encounter (Signed)
Patient was called and informed Placard is ready for pickup at front desk. Patient will come in on 11/28/2019 for pickup.

## 2020-09-01 ENCOUNTER — Telehealth: Payer: Self-pay | Admitting: Orthopedic Surgery

## 2020-09-01 NOTE — Telephone Encounter (Signed)
Can you please call pt and make an appt we have not seen him in the office since 2019. Thanks!

## 2020-09-01 NOTE — Telephone Encounter (Signed)
Patient called needing Rx for a sleeve and a Rx for another prosthetic leg. Patient said he need to take the Rx's to [facility]. The number to contact patient is [phone]

## 2020-09-03 ENCOUNTER — Encounter: Payer: Self-pay | Admitting: Physician Assistant

## 2020-09-03 ENCOUNTER — Ambulatory Visit (INDEPENDENT_AMBULATORY_CARE_PROVIDER_SITE_OTHER): Payer: Medicare HMO | Admitting: Physician Assistant

## 2020-09-03 ENCOUNTER — Telehealth: Payer: Self-pay

## 2020-09-03 DIAGNOSIS — Z89511 Acquired absence of right leg below knee: Secondary | ICD-10-CM

## 2020-09-03 MED ORDER — SILVER SULFADIAZINE 1 % EX CREA: 1.0000 "application " | TOPICAL_CREAM | Freq: Every day | CUTANEOUS | 0 refills | Status: AC

## 2020-09-03 NOTE — Telephone Encounter (Signed)
Please see below.

## 2020-09-03 NOTE — Progress Notes (Signed)
Office Visit Note   Patient: Marc Taylor           Date of Birth: 1961-06-28           MRN: [id number] Visit Date: 09/03/2020              Requested by: Blair Heys, MD [address] [place],  [place] [address] PCP: Blair Heys, MD  No chief complaint on file.     HPI: The patient is a 59 year old gentleman who presents today for evaluation of his right residual limb he status post right below-knee amputation in 2015 he states that his socket is ill fitting upon walking to the room you can hear air and release of suction with every step that he takes.  He complains of some skin breakdown in the popliteal fossa Assessment & Plan: Visit Diagnoses: No diagnosis found.  Plan: Provided an order to [facility] for new socket and prosthetic supplies.  Given an order for Silvadene to use on the skin breakdown until healed he will use this only at night.  Follow-Up Instructions: Return if symptoms worsen or fail to improve.   Ortho Exam  Patient is alert, oriented, no adenopathy, well-dressed, normal affect, normal respiratory effort. On examination of the right residual limb this is well consolidated well-healed he does have some superficial skin breakdown with erythema and dermatitis in the popliteal fossa this is about 5 cm x 2 cm there is no depth no drainage  Imaging: No results found. No images are attached to the encounter.  Labs: Lab Results  Component Value Date   HGBA1C 13.6 (H) 08/25/2013   HGBA1C 14.0 (H) 08/25/2013   HGBA1C 6.1 (H) 01/08/2013     Lab Results  Component Value Date   ALBUMIN 2.8 (L) 09/18/2014   ALBUMIN 3.0 (L) 09/07/2014   ALBUMIN 3.1 (L) 09/06/2014    Lab Results  Component Value Date   MG 2.4 08/25/2013   No results found for: VD25OH  No results found for: PREALBUMIN CBC EXTENDED Latest Ref Rng & Units 11/01/2014 09/18/2014 09/04/2014  WBC 4.0 - 10.5 K/uL 6.4 9.4 10.7(H)  RBC 4.22 - 5.81 MIL/uL 5.21 3.99(L) 4.91  HGB  13.0 - 17.0 g/dL 92.4 11.5(L) 14.7  HCT 39 - 52 % 42.1 33.7(L) 41.0  PLT 150 - 400 K/uL 296 513(H) 251     There is no height or weight on file to calculate BMI.  Orders:  No orders of the defined types were placed in this encounter.  Meds ordered this encounter  Medications  . silver sulfADIAZINE (SILVADENE) 1 % cream    Sig: Apply 1 application topically daily.    Dispense:  50 g    Refill:  0     Procedures: No procedures performed  Clinical Data: No additional findings.  ROS:  All other systems negative, except as noted in the HPI. Review of Systems  Objective: Vital Signs: There were no vitals taken for this visit.  Specialty Comments:  No specialty comments available.  PMFS History: Patient Active Problem List   Diagnosis Date Noted  . Non-pressure chronic ulcer of right calf, limited to breakdown of skin (HCC) 01/30/2018  . Acquired absence of right leg below knee (HCC) 01/30/2018  . Radicular pain of right lower extremity 01/30/2018  . Pain in right hip 01/30/2018  . Leg wound, right 10/23/2014  . Gas gangrene of foot (HCC) 09/18/2014  . Compartment syndrome of right foot (HCC) 09/04/2014  . Fracture of  fifth toe, right, closed 09/04/2014  . Compartment syndrome of foot (HCC) 09/04/2014  . Diabetes type 2, uncontrolled (HCC) 08/24/2013  . HTN (hypertension) 08/24/2013  . Hyperglycemia 08/24/2013   Past Medical History:  Diagnosis Date  . Compartment syndrome of right foot (HCC) 09/04/2014  . Constipation   . Diabetes mellitus without complication (HCC)   . Fracture of fifth toe, right, closed 09/04/2014  . History of blood transfusion 1963   "one; I had yellow jaundice" (08/24/2013)  . Hypertension   . Type II diabetes mellitus (HCC)    "just dx'd" (08/24/2013)    Family History  Problem Relation Age of Onset  . Cancer Mother   . Liver disease Mother   . Heart attack Mother   . Diabetes type II Father   . Hypertension Father   . Diabetes  Father   . Peripheral vascular disease Father        amputation  . Diabetes type II Sister   . Hypertension Sister     Past Surgical History:  Procedure Laterality Date  . AMPUTATION Right 09/18/2014   transmetatarsal         Dr [name]  . AMPUTATION Right 09/18/2014   Procedure: Right Midfoot Amputation;  Surgeon: [name], MD;  Location: [facility] OR;  Service: Orthopedics;  Laterality: Right;  . AMPUTATION Right 09/19/2014   Procedure: AMPUTATION BELOW KNEE RIGHT;  Surgeon: [name], MD;  Location: [facility] OR;  Service: Orthopedics;  Laterality: Right;  . FASCIOTOMY Right 09/04/2014   Procedure: Right Foot FASCIOTOMY and wound vac placement;  Surgeon: [name], MD;  Location[facility] OR;  Service: Orthopedics;  Laterality: Right;  . I & D EXTREMITY Right 09/06/2014   Procedure: Irrigation and Debridement Right Foot, TheraSkin graft and wound vac change out;  Surgeon: [name], MD;  Location: [facility] OR;  Service: Orthopedics;  Laterality: Right;  . OPEN REDUCTION INTERNAL FIXATION (ORIF) FOOT LISFRANC FRACTURE Right 09/04/2014   Procedure: OPEN REDUCTION INTERNAL FIXATION (ORIF) FOOT LISFRANC FRACTURE;  Surgeon: [name], MD;  Location: [facility] OR;  Service: Orthopedics;  Laterality: Right;  . STUMP REVISION Right 11/01/2014   Procedure: Revision Below Knee Amputation;  Surgeon: [name], MD;  Location: [facility] OR;  Service: Orthopedics;  Laterality: Right;   Social History   Occupational History  . Not on file  Tobacco Use  . Smoking status: Never Smoker  . Smokeless tobacco: Never Used  Substance and Sexual Activity  . Alcohol use: Yes    Comment: on occasioan   . Drug use: No  . Sexual activity: Yes

## 2020-09-03 NOTE — Telephone Encounter (Signed)
Patient called he is requesting to speak to Denny Peon about changing his prescription call [phone]

## 2022-02-23 ENCOUNTER — Telehealth: Payer: Self-pay | Admitting: Orthopedic Surgery

## 2022-02-23 NOTE — Telephone Encounter (Signed)
Pt sch'd on 02/25/22 for rx  ?

## 2022-02-23 NOTE — Telephone Encounter (Signed)
Can you please call pt. Insurance will require that he have an appt first for rx to be given. Last in office 08/2020 needs updated face to face visit for insurance to honor rx.  ?

## 2022-02-23 NOTE — Telephone Encounter (Signed)
Patient called needing a Rx for liners sent to [facility]. The number to contact patient is [phone].  ?

## 2022-02-25 ENCOUNTER — Ambulatory Visit (INDEPENDENT_AMBULATORY_CARE_PROVIDER_SITE_OTHER): Payer: Medicare Other | Admitting: Orthopedic Surgery

## 2022-02-25 DIAGNOSIS — Z89511 Acquired absence of right leg below knee: Secondary | ICD-10-CM

## 2022-03-08 ENCOUNTER — Encounter: Payer: Self-pay | Admitting: Orthopedic Surgery

## 2022-03-08 NOTE — Progress Notes (Signed)
? ?Office Visit Note ?  ?Patient: Marc Taylor           ?Date of Birth: [birth year]           ?MRN: [id number] ?Visit Date: 02/25/2022 ?             ?Requested by: Blair Heys, MD ?[address] ?[address] ?[place],  [place] [address] ?PCP: Blair Heys, MD ? ?Chief Complaint  ?Patient presents with  ? Right Leg - Follow-up  ?  HX right BKA   ? ? ? ? ?HPI: ?Patient is a 61 year old gentleman who is seen in follow-up for a right transtibial amputation.  His surgery was December 2015.  Patient has a worn-out liner and socket. ? ?Assessment & Plan: ?Visit Diagnoses:  ?1. Acquired absence of right leg below knee (HCC)   ? ? ?Plan: Patient was provided a prescription for [employer] for evaluation for new socket liner materials and supplies. ? ?Follow-Up Instructions: Return if symptoms worsen or fail to improve.  ? ?Ortho Exam ? ?Patient is alert, oriented, no adenopathy, well-dressed, normal affect, normal respiratory effort. ?Examination patient's liner is torn he has lost residual volume and has a poorly fitting socket.  There are no open ulcers on the leg.  No recent hemoglobin A1c's on file. ? ?Patient is an existing right transtibial  amputee. ? ?Patient's current comorbidities are not expected to impact the ability to function with the prescribed prosthesis. ?Patient verbally communicates a strong desire to use a prosthesis. ?Patient currently requires mobility aids to ambulate without a prosthesis.  Expects not to use mobility aids with a new prosthesis. ? ?Patient is a K3 level ambulator that spends a lot of time walking around on uneven terrain over obstacles, up and down stairs, and ambulates with a variable cadence. ? ? ? ? ?Imaging: ?No results found. ?No images are attached to the encounter. ? ?Labs: ?Lab Results  ?Component Value Date  ? HGBA1C 13.6 (H) 08/25/2013  ? HGBA1C 14.0 (H) 08/25/2013  ? HGBA1C 6.1 (H) 01/08/2013  ? ? ? ?Lab Results  ?Component Value Date  ? ALBUMIN 2.8 (L) 09/18/2014  ?  ALBUMIN 3.0 (L) 09/07/2014  ? ALBUMIN 3.1 (L) 09/06/2014  ? ? ?Lab Results  ?Component Value Date  ? MG 2.4 08/25/2013  ? ?No results found for: VD25OH ? ?No results found for: PREALBUMIN ? ?  Latest Ref Rng & Units 11/01/2014  ? 12:36 PM 09/18/2014  ? 11:04 AM 09/04/2014  ?  6:50 PM  ?CBC EXTENDED  ?WBC 4.0 - 10.5 K/uL 6.4   9.4   10.7    ?RBC 4.22 - 5.81 MIL/uL 5.21   3.99   4.91    ?Hemoglobin 13.0 - 17.0 g/dL 88.4   16.6   06.3    ?HCT 39.0 - 52.0 % 42.1   33.7   41.0    ?Platelets 150 - 400 K/uL 296   513   251    ? ? ? ?There is no height or weight on file to calculate BMI. ? ?Orders:  ?No orders of the defined types were placed in this encounter. ? ?No orders of the defined types were placed in this encounter. ? ? ? Procedures: ?No procedures performed ? ?Clinical Data: ?No additional findings. ? ?ROS: ? ?All other systems negative, except as noted in the HPI. ?Review of Systems ? ?Objective: ?Vital Signs: There were no vitals taken for this visit. ? ?Specialty Comments:  ?No specialty comments available. ? ?PMFS  History: ?Patient Active Problem List  ? Diagnosis Date Noted  ? Non-pressure chronic ulcer of right calf, limited to breakdown of skin (HCC) 01/30/2018  ? Acquired absence of right leg below knee (HCC) 01/30/2018  ? Radicular pain of right lower extremity 01/30/2018  ? Pain in right hip 01/30/2018  ? Leg wound, right 10/23/2014  ? Gas gangrene of foot (HCC) 09/18/2014  ? Compartment syndrome of right foot (HCC) 09/04/2014  ? Fracture of fifth toe, right, closed 09/04/2014  ? Compartment syndrome of foot (HCC) 09/04/2014  ? Diabetes type 2, uncontrolled 08/24/2013  ? HTN (hypertension) 08/24/2013  ? Hyperglycemia 08/24/2013  ? ?Past Medical History:  ?Diagnosis Date  ? Compartment syndrome of right foot (HCC) 09/04/2014  ? Constipation   ? Diabetes mellitus without complication (HCC)   ? Fracture of fifth toe, right, closed 09/04/2014  ? History of blood transfusion 1963  ? "one; I had yellow  jaundice" (08/24/2013)  ? Hypertension   ? Type II diabetes mellitus (HCC)   ? "just dx'd" (08/24/2013)  ?  ?Family History  ?Problem Relation Age of Onset  ? Cancer Mother   ? Liver disease Mother   ? Heart attack Mother   ? Diabetes type II Father   ? Hypertension Father   ? Diabetes Father   ? Peripheral vascular disease Father   ?     amputation  ? Diabetes type II Sister   ? Hypertension Sister   ?  ?Past Surgical History:  ?Procedure Laterality Date  ? AMPUTATION Right 09/18/2014  ? transmetatarsal         Dr [name]  ? AMPUTATION Right 09/18/2014  ? Procedure: Right Midfoot Amputation;  Surgeon: [name], MD;  Location: [facility] OR;  Service: Orthopedics;  Laterality: Right;  ? AMPUTATION Right 09/19/2014  ? Procedure: AMPUTATION BELOW KNEE RIGHT;  Surgeon: [name], MD;  Location: [facility] OR;  Service: Orthopedics;  Laterality: Right;  ? FASCIOTOMY Right 09/04/2014  ? Procedure: Right Foot FASCIOTOMY and wound vac placement;  Surgeon: [name], MD;  Location: [facility] OR;  Service: Orthopedics;  Laterality: Right;  ? I & D EXTREMITY Right 09/06/2014  ? Procedure: Irrigation and Debridement Right Foot, TheraSkin graft and wound vac change out;  Surgeon: [name], MD;  Location: [facility] OR;  Service: Orthopedics;  Laterality: Right;  ? OPEN REDUCTION INTERNAL FIXATION (ORIF) FOOT LISFRANC FRACTURE Right 09/04/2014  ? Procedure: OPEN REDUCTION INTERNAL FIXATION (ORIF) FOOT LISFRANC FRACTURE;  Surgeon: [name], MD;  Location: [facility] OR;  Service: Orthopedics;  Laterality: Right;  ? STUMP REVISION Right 11/01/2014  ? Procedure: Revision Below Knee Amputation;  Surgeon: [name], MD;  Location: [facility] OR;  Service: Orthopedics;  Laterality: Right;  ? ?Social History  ? ?Occupational History  ? Not on file  ?Tobacco Use  ? Smoking status: Never  ? Smokeless tobacco: Never  ?Substance and Sexual Activity  ? Alcohol use: Yes  ?  Comment: on occasioan   ? Drug use: No  ? Sexual activity: Yes  ? ? ? ? ? ?

## 2022-03-16 ENCOUNTER — Telehealth: Payer: Self-pay | Admitting: Orthopedic Surgery

## 2022-03-16 NOTE — Telephone Encounter (Signed)
He is calling due to disability paperwork. [name] had at her desk. We will complete and send in for him. ?

## 2022-03-16 NOTE — Telephone Encounter (Signed)
Pt is calling wanting to speak with [name] regarding Medical Forms. ? ?When I asked what kind of forms, he just said Medical forms. I just need to talk to [name]  ? ? ?

## 2022-03-22 NOTE — Telephone Encounter (Signed)
I SW pt, he said he wanted to speak with [name] directly, I let him know that I work with Dr. [name] as well and can handle any calls that come through. I asked if this is concerning his disability paperwork. He said yes. I let him know that these are still being worked on as we have been short staffed and will let him know when completed. ?

## 2023-07-05 ENCOUNTER — Encounter: Payer: Self-pay | Admitting: Family

## 2023-07-05 ENCOUNTER — Ambulatory Visit: Payer: Medicare HMO | Admitting: Family

## 2023-07-05 DIAGNOSIS — Z89511 Acquired absence of right leg below knee: Secondary | ICD-10-CM | POA: Diagnosis not present

## 2023-07-05 NOTE — Progress Notes (Signed)
Office Visit Note   Patient: Marc Taylor           Date of Birth: Nov 07, 1961           MRN: [id number] Visit Date: 07/05/2023              Requested by: Blair Heys, MD [address] [place],  [place] [address] PCP: Blair Heys, MD  Chief Complaint  Patient presents with   Right Leg - Follow-up      HPI: The patient is a 62 year old gentleman seen for evaluation of his right residual limb.  He is status post remote right below-knee amputation in December 2015.  He states his current prosthesis liner and supplies are worn out and broken down.  He is seen today requesting an prescription for new prosthesis supplies.  Assessment & Plan: Visit Diagnoses: No diagnosis found.  Plan: Given an order for prosthetic supplies he will follow-up in the office as needed  Follow-Up Instructions: Return if symptoms worsen or fail to improve.   Ortho Exam  Patient is alert, oriented, no adenopathy, well-dressed, normal affect, normal respiratory effort. On examination of the right residual limb this is well consolidated well-healed there is no open area.  Imaging: No results found. No images are attached to the encounter.  Labs: Lab Results  Component Value Date   HGBA1C 13.6 (H) 08/25/2013   HGBA1C 14.0 (H) 08/25/2013   HGBA1C 6.1 (H) 01/08/2013     Lab Results  Component Value Date   ALBUMIN 2.8 (L) 09/18/2014   ALBUMIN 3.0 (L) 09/07/2014   ALBUMIN 3.1 (L) 09/06/2014    Lab Results  Component Value Date   MG 2.4 08/25/2013   No results found for: "VD25OH"  No results found for: "PREALBUMIN"    Latest Ref Rng & Units 11/01/2014   12:36 PM 09/18/2014   11:04 AM 09/04/2014    6:50 PM  CBC EXTENDED  WBC 4.0 - 10.5 K/uL 6.4  9.4  10.7   RBC 4.22 - 5.81 MIL/uL 5.21  3.99  4.91   Hemoglobin 13.0 - 17.0 g/dL 44.0  10.2  72.5   HCT 39.0 - 52.0 % 42.1  33.7  41.0   Platelets 150 - 400 K/uL 296  513  251      There is no height or weight on file  to calculate BMI.  Orders:  No orders of the defined types were placed in this encounter.  No orders of the defined types were placed in this encounter.    Procedures: No procedures performed  Clinical Data: No additional findings.  ROS:  All other systems negative, except as noted in the HPI. Review of Systems  Objective: Vital Signs: There were no vitals taken for this visit.  Specialty Comments:  No specialty comments available.  PMFS History: Patient Active Problem List   Diagnosis Date Noted   Non-pressure chronic ulcer of right calf, limited to breakdown of skin (HCC) 01/30/2018   Acquired absence of right leg below knee (HCC) 01/30/2018   Radicular pain of right lower extremity 01/30/2018   Pain in right hip 01/30/2018   Leg wound, right 10/23/2014   Gas gangrene of foot (HCC) 09/18/2014   Compartment syndrome of right foot (HCC) 09/04/2014   Fracture of fifth toe, right, closed 09/04/2014   Compartment syndrome of foot (HCC) 09/04/2014   Diabetes type 2, uncontrolled 08/24/2013   HTN (hypertension) 08/24/2013   Hyperglycemia 08/24/2013   Past Medical History:  Diagnosis  Date   Compartment syndrome of right foot (HCC) 09/04/2014   Constipation    Diabetes mellitus without complication (HCC)    Fracture of fifth toe, right, closed 09/04/2014   History of blood transfusion 1963   "one; I had yellow jaundice" (08/24/2013)   Hypertension    Type II diabetes mellitus (HCC)    "just dx'd" (08/24/2013)    Family History  Problem Relation Age of Onset   Cancer Mother    Liver disease Mother    Heart attack Mother    Diabetes type II Father    Hypertension Father    Diabetes Father    Peripheral vascular disease Father        amputation   Diabetes type II Sister    Hypertension Sister     Past Surgical History:  Procedure Laterality Date   AMPUTATION Right 09/18/2014   transmetatarsal         Dr [name]   AMPUTATION Right 09/18/2014   Procedure: Right  Midfoot Amputation;  Surgeon: [name], MD;  Location: [facility] OR;  Service: Orthopedics;  Laterality: Right;   AMPUTATION Right 09/19/2014   Procedure: AMPUTATION BELOW KNEE RIGHT;  Surgeon: [name], MD;  Location: [facility] OR;  Service: Orthopedics;  Laterality: Right;   FASCIOTOMY Right 09/04/2014   Procedure: Right Foot FASCIOTOMY and wound vac placement;  Surgeon: [name], MD;  Location: [facility] OR;  Service: Orthopedics;  Laterality: Right;   I & D EXTREMITY Right 09/06/2014   Procedure: Irrigation and Debridement Right Foot, TheraSkin graft and wound vac change out;  Surgeon: [name], MD;  Location: [facility] OR;  Service: Orthopedics;  Laterality: Right;   OPEN REDUCTION INTERNAL FIXATION (ORIF) FOOT LISFRANC FRACTURE Right 09/04/2014   Procedure: OPEN REDUCTION INTERNAL FIXATION (ORIF) FOOT LISFRANC FRACTURE;  Surgeon: [name], MD;  Location: [facility] OR;  Service: Orthopedics;  Laterality: Right;   STUMP REVISION Right 11/01/2014   Procedure: Revision Below Knee Amputation;  Surgeon: [name], MD;  Location: [facility] OR;  Service: Orthopedics;  Laterality: Right;   Social History   Occupational History   Not on file  Tobacco Use   Smoking status: Never   Smokeless tobacco: Never  Substance and Sexual Activity   Alcohol use: Yes    Comment: on occasioan    Drug use: No   Sexual activity: Yes

## 2023-08-15 ENCOUNTER — Telehealth: Payer: Self-pay | Admitting: Orthopedic Surgery

## 2023-08-15 NOTE — Telephone Encounter (Signed)
Patient called needing you to call him back. CB#[phone]

## 2023-08-17 NOTE — Telephone Encounter (Signed)
Pt came to the office yesterday and dropped off paperwork

## 2023-08-24 ENCOUNTER — Telehealth: Payer: Self-pay | Admitting: Family

## 2023-08-24 NOTE — Telephone Encounter (Signed)
Pt requires appt. Message to the front desk to call pt to sch.

## 2023-08-24 NOTE — Telephone Encounter (Signed)
Can you please call pt and make him an appt for the next available with either Erin or Dr. [name]. For the paperwork he wants completed we need some type of documentation of his abilities. The last several years all discuss his need for new prosthetic and his active status. He will need to discuss limitations during a face to face vist/ eval with a provider. Thanks

## 2023-08-24 NOTE — Telephone Encounter (Signed)
Pt called asking for [name] F to return his call. Asking if she had a chance to look at his paperwork. Please call pt at [phone].

## 2023-09-06 ENCOUNTER — Encounter: Payer: Self-pay | Admitting: Orthopedic Surgery

## 2023-09-06 ENCOUNTER — Ambulatory Visit: Payer: Medicare HMO | Admitting: Orthopedic Surgery

## 2023-09-06 DIAGNOSIS — Z89511 Acquired absence of right leg below knee: Secondary | ICD-10-CM | POA: Diagnosis not present

## 2023-09-06 NOTE — Progress Notes (Signed)
Office Visit Note   Patient: Marc Taylor           Date of Birth: May 11, 1961           MRN: [id number] Visit Date: 09/06/2023              Requested by: Blair Heys, MD [address] [place],  [place] [address] PCP: Blair Heys, MD (Inactive)  Chief Complaint  Patient presents with   Right Leg - Follow-up    Hx right BKA revision 2015       HPI: Patient is a 62 year old gentleman with a right transtibial amputation he is 8 years out.  Patient states that his liner is torn.  Patient states he cannot stand on his leg for prolonged periods of time and cannot walk other than short distances.  Patient states that his socket is a few years old.  Assessment & Plan: Visit Diagnoses:  1. Acquired absence of right leg below knee [facility])     Plan: Patient was provided a prescription for [employer] for a new liner.  Paperwork was completed for patient to continue his full disability.  Follow-Up Instructions: No follow-ups on file.   Ortho Exam  Patient is alert, oriented, no adenopathy, well-dressed, normal affect, normal respiratory effort. Examination patient ambulates with an antalgic gait.  The liner is torn he has no ulcers.  He is subsiding into his socket but for the short distances that he tries to ambulate this is not a problem.  Imaging: No results found. No images are attached to the encounter.  Labs: Lab Results  Component Value Date   HGBA1C 13.6 (H) 08/25/2013   HGBA1C 14.0 (H) 08/25/2013   HGBA1C 6.1 (H) 01/08/2013     Lab Results  Component Value Date   ALBUMIN 2.8 (L) 09/18/2014   ALBUMIN 3.0 (L) 09/07/2014   ALBUMIN 3.1 (L) 09/06/2014    Lab Results  Component Value Date   MG 2.4 08/25/2013   No results found for: "VD25OH"  No results found for: "PREALBUMIN"    Latest Ref Rng & Units 11/01/2014   12:36 PM 09/18/2014   11:04 AM 09/04/2014    6:50 PM  CBC EXTENDED  WBC 4.0 - 10.5 K/uL 6.4  9.4  10.7   RBC 4.22 - 5.81 MIL/uL  5.21  3.99  4.91   Hemoglobin 13.0 - 17.0 g/dL 06.3  01.6  01.0   HCT 39.0 - 52.0 % 42.1  33.7  41.0   Platelets 150 - 400 K/uL 296  513  251      There is no height or weight on file to calculate BMI.  Orders:  No orders of the defined types were placed in this encounter.  No orders of the defined types were placed in this encounter.    Procedures: No procedures performed  Clinical Data: No additional findings.  ROS:  All other systems negative, except as noted in the HPI. Review of Systems  Objective: Vital Signs: There were no vitals taken for this visit.  Specialty Comments:  No specialty comments available.  PMFS History: Patient Active Problem List   Diagnosis Date Noted   Non-pressure chronic ulcer of right calf, limited to breakdown of skin (HCC) 01/30/2018   Acquired absence of right leg below knee (HCC) 01/30/2018   Radicular pain of right lower extremity 01/30/2018   Pain in right hip 01/30/2018   Leg wound, right 10/23/2014   Gas gangrene of foot (HCC) 09/18/2014  Compartment syndrome of right foot (HCC) 09/04/2014   Fracture of fifth toe, right, closed 09/04/2014   Compartment syndrome of foot (HCC) 09/04/2014   Diabetes type 2, uncontrolled 08/24/2013   HTN (hypertension) 08/24/2013   Hyperglycemia 08/24/2013   Past Medical History:  Diagnosis Date   Compartment syndrome of right foot (HCC) 09/04/2014   Constipation    Diabetes mellitus without complication (HCC)    Fracture of fifth toe, right, closed 09/04/2014   History of blood transfusion 1963   "one; I had yellow jaundice" (08/24/2013)   Hypertension    Type II diabetes mellitus (HCC)    "just dx'd" (08/24/2013)    Family History  Problem Relation Age of Onset   Cancer Mother    Liver disease Mother    Heart attack Mother    Diabetes type II Father    Hypertension Father    Diabetes Father    Peripheral vascular disease Father        amputation   Diabetes type II Sister     Hypertension Sister     Past Surgical History:  Procedure Laterality Date   AMPUTATION Right 09/18/2014   transmetatarsal         Dr [name]   AMPUTATION Right 09/18/2014   Procedure: Right Midfoot Amputation;  Surgeon: [name], MD;  Location[facility] OR;  Service: Orthopedics;  Laterality: Right;   AMPUTATION Right 09/19/2014   Procedure: AMPUTATION BELOW KNEE RIGHT;  Surgeon: [name], MD;  Location: [facility] OR;  Service: Orthopedics;  Laterality: Right;   FASCIOTOMY Right 09/04/2014   Procedure: Right Foot FASCIOTOMY and wound vac placement;  Surgeon: [name], MD;  Location: [facility] OR;  Service: Orthopedics;  Laterality: Right;   I & D EXTREMITY Right 09/06/2014   Procedure: Irrigation and Debridement Right Foot, TheraSkin graft and wound vac change out;  Surgeon: [name], MD;  Location: [facility] OR;  Service: Orthopedics;  Laterality: Right;   OPEN REDUCTION INTERNAL FIXATION (ORIF) FOOT LISFRANC FRACTURE Right 09/04/2014   Procedure: OPEN REDUCTION INTERNAL FIXATION (ORIF) FOOT LISFRANC FRACTURE;  Surgeon: [name], MD;  Location: [facility] OR;  Service: Orthopedics;  Laterality: Right;   STUMP REVISION Right 11/01/2014   Procedure: Revision Below Knee Amputation;  Surgeon: [name], MD;  Location: [facility] OR;  Service: Orthopedics;  Laterality: Right;   Social History   Occupational History   Not on file  Tobacco Use   Smoking status: Never   Smokeless tobacco: Never  Substance and Sexual Activity   Alcohol use: Yes    Comment: on occasioan    Drug use: No   Sexual activity: Yes

## 2023-09-23 ENCOUNTER — Telehealth: Payer: Self-pay | Admitting: Family

## 2023-09-23 NOTE — Telephone Encounter (Signed)
Patient called asked for a call back letting him know when the paperwork was sent off. Patient said this is time sensitive.  The number to contact patient is [phone]

## 2023-09-26 NOTE — Telephone Encounter (Signed)
[name] has the forms, these will be completed today.

## 2023-09-27 NOTE — Telephone Encounter (Signed)
SW pt,informed him that forms were ready for pick up. We cannot send them as he has a portion to fill out on this form and he can place them in the mail once he completes that.

## 2024-09-17 ENCOUNTER — Encounter: Payer: Self-pay | Admitting: Radiology

## 2024-09-24 ENCOUNTER — Telehealth: Payer: Self-pay | Admitting: Orthopedic Surgery

## 2024-09-24 NOTE — Telephone Encounter (Signed)
 This pt has not been seen in a year. Insurance will require he have a visit within the past 6 months face to face. Can you please call pt to sch appt in the next weeks weeks.

## 2024-09-24 NOTE — Telephone Encounter (Signed)
 Pt need a new Rx for [employer]

## 2024-09-25 ENCOUNTER — Ambulatory Visit: Admitting: Physician Assistant

## 2024-09-25 DIAGNOSIS — Z89511 Acquired absence of right leg below knee: Secondary | ICD-10-CM

## 2024-09-26 ENCOUNTER — Encounter: Payer: Self-pay | Admitting: Physician Assistant

## 2024-09-26 NOTE — Progress Notes (Signed)
 Office Visit Note   Patient: Marc Taylor           Date of Birth: September 18, 1961           MRN: [id number] Visit Date: 09/25/2024              Requested by: No referring provider defined for this encounter. PCP: Hugh Charleston, MD (Inactive)  Chief Complaint  Patient presents with   Right Leg - Follow-up    Hx right BKA      HPI: 63 y/o male with history of right BKA.  The amputation was > 9 years ago and he states the screw in the bottom of the prothesis is loose.  He is having a difficult time ambulating with the prothesis at this time.    Assessment & Plan: Visit Diagnoses:  1. Acquired absence of right leg below knee (HCC)     Plan: He was given an order to follow up with [facility] for right BKA prothesis revision with supplies as needed.    Follow-Up Instructions: Return if symptoms worsen or fail to improve.   Ortho Exam  Patient is alert, oriented, no adenopathy, well-dressed, normal affect, normal respiratory effort. Antalgic gate.  No pressure ulcers or wounds per the patient.  The sleeve is subsiding and moving up/down due to the loose screw.      Imaging: No results found. No images are attached to the encounter.  Labs: Lab Results  Component Value Date   HGBA1C 13.6 (H) 08/25/2013   HGBA1C 14.0 (H) 08/25/2013   HGBA1C 6.1 (H) 01/08/2013     Lab Results  Component Value Date   ALBUMIN  2.8 (L) 09/18/2014   ALBUMIN  3.0 (L) 09/07/2014   ALBUMIN  3.1 (L) 09/06/2014    Lab Results  Component Value Date   MG 2.4 08/25/2013   No results found for: VD25OH  No results found for: PREALBUMIN    Latest Ref Rng & Units 11/01/2014   12:36 PM 09/18/2014   11:04 AM 09/04/2014    6:50 PM  CBC EXTENDED  WBC 4.0 - 10.5 K/uL 6.4  9.4  10.7   RBC 4.22 - 5.81 MIL/uL 5.21  3.99  4.91   Hemoglobin 13.0 - 17.0 g/dL 85.6  88.4  85.2   HCT 39.0 - 52.0 % 42.1  33.7  41.0   Platelets 150 - 400 K/uL 296  513  251      There is no height or weight on file  to calculate BMI.  Orders:  No orders of the defined types were placed in this encounter.  No orders of the defined types were placed in this encounter.    Procedures: No procedures performed  Clinical Data: No additional findings.  ROS:  All other systems negative, except as noted in the HPI. Review of Systems  Objective: Vital Signs: There were no vitals taken for this visit.  Specialty Comments:  No specialty comments available.  PMFS History: Patient Active Problem List   Diagnosis Date Noted   Non-pressure chronic ulcer of right calf, limited to breakdown of skin (HCC) 01/30/2018   Acquired absence of right leg below knee (HCC) 01/30/2018   Radicular pain of right lower extremity 01/30/2018   Pain in right hip 01/30/2018   Leg wound, right 10/23/2014   Gas gangrene of foot (HCC) 09/18/2014   Compartment syndrome of right foot 09/04/2014   Fracture of fifth toe, right, closed 09/04/2014   Compartment syndrome of foot 09/04/2014   Diabetes type  2, uncontrolled 08/24/2013   HTN (hypertension) 08/24/2013   Hyperglycemia 08/24/2013   Past Medical History:  Diagnosis Date   Compartment syndrome of right foot 09/04/2014   Constipation    Diabetes mellitus without complication (HCC)    Fracture of fifth toe, right, closed 09/04/2014   History of blood transfusion 1963   one; I had yellow jaundice (08/24/2013)   Hypertension    Type II diabetes mellitus (HCC)    just dx'd (08/24/2013)    Family History  Problem Relation Age of Onset   Cancer Mother    Liver disease Mother    Heart attack Mother    Diabetes type II Father    Hypertension Father    Diabetes Father    Peripheral vascular disease Father        amputation   Diabetes type II Sister    Hypertension Sister     Past Surgical History:  Procedure Laterality Date   AMPUTATION Right 09/18/2014   transmetatarsal         Dr Harden   AMPUTATION Right 09/18/2014   Procedure: Right Midfoot  Amputation;  Surgeon: Jerona Harden GAILS, MD;  Location: [facility] OR;  Service: Orthopedics;  Laterality: Right;   AMPUTATION Right 09/19/2014   Procedure: AMPUTATION BELOW KNEE RIGHT;  Surgeon: Jerona Harden GAILS, MD;  Location: [facility] OR;  Service: Orthopedics;  Laterality: Right;   FASCIOTOMY Right 09/04/2014   Procedure: Right Foot FASCIOTOMY and wound vac placement;  Surgeon: Fonda SHAUNNA Olmsted, MD;  Location: [facility] OR;  Service: Orthopedics;  Laterality: Right;   I & D EXTREMITY Right 09/06/2014   Procedure: Irrigation and Debridement Right Foot, TheraSkin graft and wound vac change out;  Surgeon: Jerona Harden GAILS, MD;  Location: [facility] OR;  Service: Orthopedics;  Laterality: Right;   OPEN REDUCTION INTERNAL FIXATION (ORIF) FOOT LISFRANC FRACTURE Right 09/04/2014   Procedure: OPEN REDUCTION INTERNAL FIXATION (ORIF) FOOT LISFRANC FRACTURE;  Surgeon: Fonda SHAUNNA Olmsted, MD;  Location: [facility] OR;  Service: Orthopedics;  Laterality: Right;   STUMP REVISION Right 11/01/2014   Procedure: Revision Below Knee Amputation;  Surgeon: Jerona Harden GAILS, MD;  Location: [facility] OR;  Service: Orthopedics;  Laterality: Right;   Social History   Occupational History   Not on file  Tobacco Use   Smoking status: Never   Smokeless tobacco: Never  Substance and Sexual Activity   Alcohol use: Yes    Comment: on occasioan    Drug use: No   Sexual activity: Yes

## 2024-12-05 ENCOUNTER — Telehealth: Payer: Self-pay

## 2024-12-05 NOTE — Telephone Encounter (Signed)
 Patient dropped off handicap placard form that needs to be complete. Patient wanted me to go get the nurse to get them to fill it out. Advised patient they will call him when it is ready for pick-up and the nurse is seeing patients at the moment and he would have to leave the form.

## 2024-12-05 NOTE — Telephone Encounter (Signed)
 To Dr. Crist team
